# Patient Record
Sex: Female | Born: 1960
Health system: Southern US, Community
[De-identification: ages and names within clinical notes are randomized; demographics above are authoritative.]

## PROBLEM LIST (undated history)

## (undated) DIAGNOSIS — J189 Pneumonia, unspecified organism: Secondary | ICD-10-CM

## (undated) DIAGNOSIS — G43909 Migraine, unspecified, not intractable, without status migrainosus: Secondary | ICD-10-CM

## (undated) DIAGNOSIS — K5792 Diverticulitis of intestine, part unspecified, without perforation or abscess without bleeding: Secondary | ICD-10-CM

## (undated) DIAGNOSIS — R42 Dizziness and giddiness: Secondary | ICD-10-CM

## (undated) DIAGNOSIS — K5790 Diverticulosis of intestine, part unspecified, without perforation or abscess without bleeding: Secondary | ICD-10-CM

## (undated) DIAGNOSIS — E669 Obesity, unspecified: Secondary | ICD-10-CM

## (undated) HISTORY — DX: Migraine, unspecified, not intractable, without status migrainosus: G43.909

## (undated) HISTORY — DX: Diverticulitis of intestine, part unspecified, without perforation or abscess without bleeding: K57.92

## (undated) HISTORY — DX: Dizziness and giddiness: R42

## (undated) HISTORY — PX: OTHER SURGICAL HISTORY: SHX169

## (undated) HISTORY — DX: Obesity, unspecified: E66.9

## (undated) HISTORY — DX: Pneumonia, unspecified organism: J18.9

## (undated) HISTORY — DX: Diverticulosis of intestine, part unspecified, without perforation or abscess without bleeding: K57.90

---

## 1998-06-10 ENCOUNTER — Other Ambulatory Visit: Admission: RE | Admit: 1998-06-10 | Discharge: 1998-06-10 | Payer: Self-pay | Admitting: Obstetrics & Gynecology

## 1999-07-23 ENCOUNTER — Other Ambulatory Visit: Admission: RE | Admit: 1999-07-23 | Discharge: 1999-07-23 | Payer: Self-pay | Admitting: Obstetrics & Gynecology

## 2001-02-01 ENCOUNTER — Other Ambulatory Visit: Admission: RE | Admit: 2001-02-01 | Discharge: 2001-02-01 | Payer: Self-pay | Admitting: Obstetrics & Gynecology

## 2002-03-17 ENCOUNTER — Other Ambulatory Visit: Admission: RE | Admit: 2002-03-17 | Discharge: 2002-03-17 | Payer: Self-pay | Admitting: Obstetrics & Gynecology

## 2003-08-06 ENCOUNTER — Other Ambulatory Visit: Admission: RE | Admit: 2003-08-06 | Discharge: 2003-08-06 | Payer: Self-pay | Admitting: Obstetrics & Gynecology

## 2004-08-20 ENCOUNTER — Other Ambulatory Visit: Admission: RE | Admit: 2004-08-20 | Discharge: 2004-08-20 | Payer: Self-pay | Admitting: Obstetrics & Gynecology

## 2005-01-26 ENCOUNTER — Ambulatory Visit: Payer: Self-pay | Admitting: Internal Medicine

## 2005-07-27 ENCOUNTER — Ambulatory Visit: Payer: Self-pay | Admitting: Internal Medicine

## 2005-11-27 ENCOUNTER — Ambulatory Visit: Payer: Self-pay | Admitting: Internal Medicine

## 2006-04-02 ENCOUNTER — Encounter: Admission: RE | Admit: 2006-04-02 | Discharge: 2006-04-02 | Payer: Self-pay | Admitting: Internal Medicine

## 2006-04-02 ENCOUNTER — Ambulatory Visit: Payer: Self-pay | Admitting: Internal Medicine

## 2006-09-20 ENCOUNTER — Ambulatory Visit: Payer: Self-pay | Admitting: Family Medicine

## 2007-06-21 ENCOUNTER — Encounter: Payer: Self-pay | Admitting: Internal Medicine

## 2008-01-16 ENCOUNTER — Ambulatory Visit: Payer: Self-pay | Admitting: Family Medicine

## 2008-01-16 DIAGNOSIS — J45909 Unspecified asthma, uncomplicated: Secondary | ICD-10-CM | POA: Insufficient documentation

## 2008-02-20 ENCOUNTER — Ambulatory Visit: Payer: Self-pay | Admitting: Internal Medicine

## 2008-02-20 DIAGNOSIS — F17200 Nicotine dependence, unspecified, uncomplicated: Secondary | ICD-10-CM

## 2008-02-20 DIAGNOSIS — R05 Cough: Secondary | ICD-10-CM

## 2008-02-20 DIAGNOSIS — R059 Cough, unspecified: Secondary | ICD-10-CM | POA: Insufficient documentation

## 2008-03-25 ENCOUNTER — Emergency Department (HOSPITAL_COMMUNITY): Admission: EM | Admit: 2008-03-25 | Discharge: 2008-03-25 | Payer: Self-pay | Admitting: Emergency Medicine

## 2008-04-13 ENCOUNTER — Ambulatory Visit: Payer: Self-pay | Admitting: Internal Medicine

## 2008-04-13 DIAGNOSIS — J302 Other seasonal allergic rhinitis: Secondary | ICD-10-CM

## 2008-04-13 DIAGNOSIS — J3089 Other allergic rhinitis: Secondary | ICD-10-CM

## 2008-04-30 ENCOUNTER — Ambulatory Visit: Payer: Self-pay | Admitting: Internal Medicine

## 2008-07-12 ENCOUNTER — Ambulatory Visit: Payer: Self-pay | Admitting: Internal Medicine

## 2009-01-17 ENCOUNTER — Ambulatory Visit: Payer: Self-pay | Admitting: Internal Medicine

## 2009-02-26 ENCOUNTER — Telehealth (INDEPENDENT_AMBULATORY_CARE_PROVIDER_SITE_OTHER): Payer: Self-pay | Admitting: *Deleted

## 2009-02-26 ENCOUNTER — Ambulatory Visit: Payer: Self-pay | Admitting: Internal Medicine

## 2009-02-27 ENCOUNTER — Telehealth: Payer: Self-pay | Admitting: Internal Medicine

## 2009-04-10 ENCOUNTER — Ambulatory Visit: Payer: Self-pay | Admitting: Family Medicine

## 2009-04-10 DIAGNOSIS — H8110 Benign paroxysmal vertigo, unspecified ear: Secondary | ICD-10-CM

## 2009-06-27 ENCOUNTER — Encounter: Admission: RE | Admit: 2009-06-27 | Discharge: 2009-06-27 | Payer: Self-pay | Admitting: Otolaryngology

## 2009-09-24 ENCOUNTER — Ambulatory Visit: Payer: Self-pay | Admitting: Internal Medicine

## 2009-09-24 DIAGNOSIS — R41840 Attention and concentration deficit: Secondary | ICD-10-CM

## 2009-09-26 ENCOUNTER — Encounter: Payer: Self-pay | Admitting: Internal Medicine

## 2009-09-26 ENCOUNTER — Ambulatory Visit: Payer: Self-pay | Admitting: Licensed Clinical Social Worker

## 2009-10-01 ENCOUNTER — Ambulatory Visit: Payer: Self-pay | Admitting: Licensed Clinical Social Worker

## 2009-10-02 ENCOUNTER — Ambulatory Visit: Payer: Self-pay | Admitting: Licensed Clinical Social Worker

## 2010-04-14 ENCOUNTER — Telehealth (INDEPENDENT_AMBULATORY_CARE_PROVIDER_SITE_OTHER): Payer: Self-pay | Admitting: *Deleted

## 2010-05-06 NOTE — Assessment & Plan Note (Signed)
Summary: acute dizziness/dm   Vital Signs:  Patient profile:   50 year old female Weight:      180 pounds Temp:     98.4 degrees F oral BP sitting:   110 / 82  (left arm) Cuff size:   regular  Vitals Entered By: Sid Falcon LPN (April 10, 2009 9:54 AM) CC: acute dizziness X 2 days   History of Present Illness: Acute visit for dizziness. This initially started 2 days ago. This is consistently triggered by head movement and mostly to the left side. By description this is a vertigo type dizziness. No syncope  and no presyncopal or lightheaded symptoms. Denies palpitations. Symptoms seem to be triggered more with movement to the left side. No acute hearing changes. Denies nausea, vomiting, speech changes, visual changes or a focal weakness. Took a decongestant without relief. Denies any ear fullness or ear pain. No fever or chills.  Allergies: 1)  ! Penicillin 2)  ! Sulfa 3)  Mobic (Meloxicam)  Past History:  Past Medical History: Last updated: 04/13/2008 Asthma  dx 30      ETS as a child  Pneumonia   Review of Systems      See HPI  Physical Exam  General:  Well-developed,well-nourished,in no acute distress; alert,appropriate and cooperative throughout examination Eyes:  No corneal or conjunctival inflammation noted. EOMI. Perrla. Funduscopic exam benign, without hemorrhages, exudates or papilledema. Vision grossly normal. Ears:  External ear exam shows no significant lesions or deformities.  Otoscopic examination reveals clear canals, tympanic membranes are intact bilaterally without bulging, retraction, inflammation or discharge. Hearing is grossly normal bilaterally. Mouth:  Oral mucosa and oropharynx without lesions or exudates.  Teeth in good repair. Neck:  No deformities, masses, or tenderness noted. Lungs:  Normal respiratory effort, chest expands symmetrically. Lungs are clear to auscultation, no crackles or wheezes. Heart:  Normal rate and regular rhythm. S1 and S2  normal without gallop, murmur, click, rub or other extra sounds. Neurologic:  alert & oriented X3, cranial nerves II-XII intact, strength normal in all extremities, sensation intact to light touch, and finger-to-nose normal.  patient has vertigo which is triggered with movement of the head to the left but not the right No nystagmus. No ataxia   Impression & Recommendations:  Problem # 1:  BENIGN PAROXYSMAL POSITIONAL VERTIGO (ICD-386.11) Assessment New we explained to patient medications are frequent not helpful. Consider vestibular rehabilitation exercises if symptoms persist. Discussed safety issues  associated with this.  No driving until this improves. Her updated medication list for this problem includes:    Zyrtec Allergy 10 Mg Tabs (Cetirizine hcl) .[name]... Take 1 by mouth once daily  Complete Medication List: 1)  Ventolin Hfa 108 (90 Base) Mcg/act Aers (Albuterol sulfate) .... 2 puffs every 4 hours as needed 2)  Flonase 50 Mcg/act Susp (Fluticasone propionate) .... 2 sprays each nares q d 3)  Qvar 40 Mcg/act Aers (Beclomethasone dipropionate) .... 2 puffs two times a day 4)  Zyrtec Allergy 10 Mg Tabs (Cetirizine hcl) .... Take 1 by mouth once daily  Patient Instructions: 1)  avoid driving until symptoms have fully cleared. 2)  Change positions slowly. 3)  Follow up immediately if any new symptoms such as vision change, speech change or any focal weakness.

## 2010-05-06 NOTE — Letter (Signed)
Summary: Behavioral Medicine Note, Neurocognitive Test Results   Behavioral Medicine Note, Neurocognitive Test Results   Imported By: Maryln Gottron 11/05/2009 11:15:28  _____________________________________________________________________  External Attachment:    Type:   Image     Comment:   External Document

## 2010-05-06 NOTE — Assessment & Plan Note (Signed)
Summary: consult re: adult adhd/cjr   Vital Signs:  Patient profile:   50 year old female Menstrual status:  perimenopausal Height:      66.5 inches Weight:      194 pounds Pulse rate:   72 / minute BP sitting:   120 / 60  (right arm) Cuff size:   regular  Vitals Entered By: Romualdo Bolk, CMA (AAMA) (September 24, 2009 1:09 PM) CC: Discuss ADD- Family Hx of ADD- Pt has trouble focusing, easily distracted. She has more unfinished stuff in house.      Menstrual Status perimenopausal   History of Present Illness: Elizabeth Pratt comes in today   for above concern  last appt with me was 11/09 .    Since last time has had no major change in health status has gyne and pulm doc for  appropriate care .   However  recently  Noted children in the family  with add  issues  and dx  She did some  screening surveys ? adult ars scale    on line   . She says she "  Looks off the scale"  of adhd. symptoms     Now child   Elizabeth Pratt    dx :  with  add   recently has noted same experience and signs in her own life.      Mom noted mirrored experience.       poor finishing   and better in a crisiis than deadline  Also life changes  that . mom passed this year.     job changes .      Problems with relationship with Husband    . 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    Now  current but very disorganized  .  pilles of paid bills all over   and none are filed.   Now is  beginning to  shred things.  Some days are better than others r    With increase caffiene.   NOtes more efficiency.  Procrastination with difficult tasks.     [name] good for emergencies.    School issues  when young   .   where she didnt finish college  .   did well when put the effort in to concentrate. No depression,  sleep issues.    except  menopause.        Preventive Screening-Counseling & Management  Alcohol-Tobacco     Alcohol drinks/day: <1     Alcohol type: wine     Smoking Status:  current     Packs/Day: <0.25  Caffeine-Diet-Exercise     Caffeine use/day: 4-5 cups in am     Does Patient Exercise: no  Current Medications (verified): 1)  Ventolin Hfa 108 (90 Base) Mcg/act Aers (Albuterol Sulfate) .... 2 Puffs Every 4 Hours As Needed 2)  Flonase 50 Mcg/act Susp (Fluticasone Propionate) .... 2 Sprays Each Nares Q D 3)  Qvar 40 Mcg/act Aers (Beclomethasone Dipropionate) .... 2 Puffs Two Times A Day 4)  Zyrtec Allergy 10 Mg Tabs (Cetirizine Hcl) .... Take 1 By Mouth Once Daily  Allergies (verified): 1)  ! Penicillin 2)  ! Sulfa 3)  Mobic (Meloxicam)  Past History:  Past medical, surgical, family and social histories (including risk factors) reviewed, and no changes noted (except as noted below).  Past Medical History: Asthma  dx 30      ETS as a child  Pneumonia  Vertigo evaluation  poss atypical  migraines   ? MRI   Past Surgical History: Reviewed history from 04/13/2008 and no changes required. Dental extractions  Past History:  Care Management: Pulmonary: Young Orthopedics: Gramig Gynecology: Arlyce Dice ENT:   Family History: Reviewed history from 02/26/2009 and no changes required. Mom has asthma , breast ca- died  ? attentional  2/5 sisters have asthma .    smokers.  sisterbreast ca, sister- ovarian ca  ssiter   /  family hx of add  son  Social History: Reviewed history from 04/13/2008 and no changes required. 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         vertigo    eval  mri and other tests and thyroid and normal    ? atypical   migraine.    NEg  CV problems   Physical Exam  General:  Well-developed,well-nourished,in no acute distress; alert,appropriate and cooperative throughout examination talkaive and animated  no increase motor othrewise  Head:  normocephalic and atraumatic.   Neck:  No deformities, masses, or tenderness noted. Lungs:  Normal respiratory effort, chest expands symmetrically. Lungs are clear to auscultation, no crackles or wheezes. Heart:  Normal rate and regular rhythm. S1 and S2 normal without gallop, murmur, click, rub or other extra sounds. Abdomen:  Bowel sounds positive,abdomen soft and non-tender without masses, organomegaly or   noted. Pulses:  pulses intact without delay   Extremities:  no clubbing cyanosis or edema  Neurologic:  alert & oriented X3, strength normal in all extremities, gait normal, and DTRs symmetrical and normal.   no tremor and non focal  Skin:  turgor normal and color normal.   Cervical Nodes:  No lymphadenopathy noted Psych:  Oriented X3.  Pt is A&Ox3,affect,speech,memory,attention,&motor skills appear intact.   speaks fast  but normal thought process  and cognition   Impression & Recommendations:  Problem # 1:  ATTENTION OR CONCENTRATION DEFICIT (ICD-799.51) criteria appear to be met for add   by hx  and family hx and  no other  explanations .   but  never had any evaluation    will have her make appt for  further eval  refer to behavioral health  [name].  and then follow up to see if medcation approrpriate.  does appear to have affected  more than one area of life ( school drop out of college and home life organization )but does well with job .    Problem # 2:  TOBACCO USE (ICD-305.1) should quit  counseled   Complete Medication List: 1)  Ventolin Hfa 108 (90 Base) Mcg/act Aers (Albuterol sulfate) .... 2 puffs every 4 hours as needed 2)  Flonase 50 Mcg/act Susp (Fluticasone propionate) .... 2 sprays each nares q d 3)  Qvar 40 Mcg/act Aers (Beclomethasone dipropionate) .... 2  puffs two times a day 4)  Zyrtec Allergy 10 Mg Tabs (Cetirizine hcl) .... Take 1 by mouth once daily  Patient Instructions: 1)  your hx if consistent with add  .    2)  suggest some  further evaluation.   [name]  3)  Then follow up  visit.  4)  You need to stop smoking.

## 2010-05-08 NOTE — Progress Notes (Signed)
Summary: rx req- today  Phone Note Call from Patient   Caller: Patient Call For: young Summary of Call: pt has made an appt w/ dr young. please call in rx for ventolin today asap. she will keep her appt. cell [phone] Initial call taken by: Tivis Ringer, CNA,  April 14, 2010 5:08 PM  Follow-up for Phone Call        Spoke with pt and notifed rx was sent to pharm and that she should keep rov for 05/09/10 for future refills.   Pt verbalized understanding. Follow-up by: Vernie Murders,  April 14, 2010 5:11 PM    Prescriptions: VENTOLIN HFA 108 (90 BASE) MCG/ACT AERS (ALBUTEROL SULFATE) 2 puffs every 4 hours as needed  #1 x 0   Entered by:   Vernie Murders   Authorized by:   Waymon Budge MD   Signed by:   Vernie Murders on 04/14/2010   Method used:   Electronically to        [facility]. [phone]* (retail)       [phone]       [place], [place]  [address]       Ph: [phone]       Fax: [phone]   RxID:   [id number]

## 2010-05-09 ENCOUNTER — Encounter: Payer: Self-pay | Admitting: Internal Medicine

## 2010-05-09 ENCOUNTER — Ambulatory Visit: Admit: 2010-05-09 | Payer: Self-pay | Admitting: Internal Medicine

## 2010-05-09 ENCOUNTER — Ambulatory Visit (INDEPENDENT_AMBULATORY_CARE_PROVIDER_SITE_OTHER): Payer: Managed Care, Other (non HMO) | Admitting: Internal Medicine

## 2010-05-09 DIAGNOSIS — F17200 Nicotine dependence, unspecified, uncomplicated: Secondary | ICD-10-CM

## 2010-05-09 DIAGNOSIS — J45909 Unspecified asthma, uncomplicated: Secondary | ICD-10-CM

## 2010-05-09 DIAGNOSIS — J309 Allergic rhinitis, unspecified: Secondary | ICD-10-CM

## 2010-05-14 NOTE — Assessment & Plan Note (Signed)
Summary: ROV   Vital Signs:  Patient profile:   50 year old female Menstrual status:  perimenopausal O2 Sat:      97 % on Room air Pulse rate:   68 / minute BP sitting:   142 / 86  (right arm) Cuff size:   large  Vitals Entered By: Gweneth Dimitri RN (May 09, 2010 3:26 PM)  O2 Flow:  Room air CC: Follow up for rxs.  Pt states breathing is doing well overall.  Denies SOB, wheezing, chest tightness, cough.   Comments Medications reviewed with patient Daytime contact number verified with patient. Gweneth Dimitri RN  May 09, 2010 3:27 PM    Primary Provider/Referring Provider:  Panosh  CC:  Follow up for rxs.  Pt states breathing is doing well overall.  Denies SOB, wheezing, chest tightness, and cough.  .  History of Present Illness:  07/12/08- Asthma, allergic rhinitis, tobacco Pollen is not bothering her as badly as some years. Does have some cough - not purulent or bloody. Uses Mucinex as needed. Using Ventolin rescue inhaler about once every other day. Continues using Qvar  and flonase. Reviewed PFT= mild obstruction small airways with response to bronchodilator.  January 17, 2009- Asthma, allergic rhinitis, tobacco Meds have usually done well and been sufficient. Using rescue Ventolin only twice weekly. No acute changes.  February 26, 2009- Asthma, allergic rhinitis, tobacco 1 week acutely ill after trip out of town,. She and daughter got sick. Chest congestion, productive cough green, low fever. Took some left over prednisone. Used up a rescue inhaler. Not bad sinus inflammation. Using mucinex-d.  May 09, 2010- Asthma, allergic rhinitis, tobacco Nurse-CC: Follow up for rxs.  Pt states breathing is doing well overall.  Denies SOB, wheezing, chest tightness, cough.   Trying to quit smoking. Got hoarse and gained weight when she tried off cigarettes for 3 weeks. We discussed smoking cesssation process. Chest feels ok with no cough, wheeze etc. Minor colds working  with kids.    Asthma History    Initial Asthma Severity Rating:    Age range: 12+ years    Symptoms: 0-2 days/week    Nighttime Awakenings: 0-2/month    Interferes w/ normal activity: no limitations    SABA use (not for EIB): 0-2 days/week    Asthma Severity Assessment: Intermittent   Preventive Screening-Counseling & Management  Alcohol-Tobacco     Alcohol drinks/day: <1     Alcohol type: wine     Smoking Status: current     Smoking Cessation Counseling: yes     Smoke Cessation Stage: contemplative     Packs/Day: <0.25     Tobacco Counseling: to quit use of tobacco products  Current Medications (verified): 1)  Ventolin Hfa 108 (90 Base) Mcg/act Aers (Albuterol Sulfate) .... 2 Puffs Every 4 Hours As Needed 2)  Flonase 50 Mcg/act Susp (Fluticasone Propionate) .... 2 Sprays Each Nares Once Daily As Needed 3)  Qvar 40 Mcg/act Aers (Beclomethasone Dipropionate) .... 2 Puffs Two Times A Day 4)  Zyrtec Allergy 10 Mg Tabs (Cetirizine Hcl) .... Take 1 By Mouth Once Daily  Allergies (verified): 1)  ! Penicillin 2)  ! Sulfa 3)  Mobic (Meloxicam)  Past History:  Past Medical History: Last updated: 09/24/2009 Asthma  dx 30      ETS as a child  Pneumonia  Vertigo evaluation  poss atypical  migraines   ? MRI   Past Surgical History: Last updated: 04/13/2008 Dental extractions  Family History:  Last updated: 09/24/2009 Mom has asthma , breast ca- died  ? attentional  2/5 sisters have asthma .    smokers.  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  The patient denies shortness of breath with activity, shortness of breath at rest, productive cough, coughing up blood, chest pain, irregular heartbeats, acid heartburn, indigestion, loss of appetite, weight change, abdominal pain, difficulty swallowing, sore throat, tooth/dental problems, headaches, and sneezing.    Physical Exam  Additional Exam:  General: A/Ox3; pleasant and cooperative, NAD, SKIN: no rash, lesions. Flushed NODES: no lymphadenopathy HEENT: [place]/AT, EOM- WNL, Conjuctivae- clear, PERRLA, TM-WNL, Nose- clear, Throat- clear and wnl, Mallampati  II NECK: Supple w/ fair ROM, JVD- none, normal carotid impulses w/o bruits Thyroid-  CHEST: harsh rattling cough with wheeze HEART: RRR, no m/g/r heard ABDOMEN: EAV:WUJW, nl pulses, no edema  NEURO: Grossly intact to observation      Impression & Recommendations:  Problem # 1:  ALLERGIC RHINITIS (ICD-477.9) Tobacco irritation, vasomotor rhinitis and allergic rhinitis were discussed and compared. Possible mild viral rhinitis now. Her updated medication list for this problem includes:    Flonase 50 Mcg/act Susp (Fluticasone propionate) .[name]... 2 sprays each nares once daily as needed    Zyrtec Allergy 10 Mg Tabs (Cetirizine hcl) .[name]... Take 1 by mouth once daily  Problem # 2:  TOBACCO USE (ICD-305.1) We spent 20 minutes on smoking cessation today. I think she can get a grip on this issue with encouragement to try again.   Problem # 3:  ACUTE BRONCHITIS (ICD-466.0) Intermittent bronchitis. Her updated medication list for this problem includes:    Ventolin Hfa 108 (90 Base) Mcg/act Aers (Albuterol sulfate) .[name]... 2 puffs every 4 hours as needed    Qvar 40 Mcg/act Aers (Beclomethasone dipropionate) .[name]... 2 puffs two times a day  Medications Added to Medication List This Visit: 1)  Flonase 50 Mcg/act Susp (Fluticasone propionate)  .... 2 sprays each nares once daily as needed  Other Orders: Est. Patient Level III (11914)  Patient Instructions: 1)  Please schedule a follow-up appointment in 6 months. 2)  Please keep trying with the smoking- in the long run you will be better off without the cigarettes.  3)  Consider trying otc Nasalcrom/ cromolyn nasal spray 4)  Med refills Prescriptions: QVAR 40 MCG/ACT AERS (BECLOMETHASONE DIPROPIONATE) 2 puffs two times a day  #3 x 3   Entered and Authorized by:   Waymon Budge MD   Signed by:   Waymon Budge MD on 05/09/2010   Method used:   Print then Give to Patient   RxID:   [id number] FLONASE 50 MCG/ACT SUSP (FLUTICASONE PROPIONATE) 2 sprays each nares once daily as needed  #3 x 3   Entered and Authorized by:   Waymon Budge MD   Signed by:   Waymon Budge MD on 05/09/2010   Method  used:   Print then Give to Patient   RxID:   [id number] VENTOLIN HFA 108 (90 BASE) MCG/ACT AERS (ALBUTEROL SULFATE) 2 puffs every 4 hours as needed  #3 x 3   Entered and Authorized by:   Waymon Budge MD   Signed by:   Waymon Budge MD on 05/09/2010   Method used:   Print then Give to Patient   RxID:   [id number]    Orders Added: 1)  Est. Patient Level III [95284]

## 2010-08-26 ENCOUNTER — Telehealth: Payer: Self-pay | Admitting: Internal Medicine

## 2010-08-26 NOTE — Telephone Encounter (Signed)
Katie, pls advise status of this form thanks!

## 2010-08-27 NOTE — Telephone Encounter (Signed)
I have called patient to see what information she is referring to. Schedulers are aware to direct call to me only!

## 2010-08-27 NOTE — Telephone Encounter (Signed)
Please let patient know that CY nor myself have seen any papers on patient-also that any insurance papers should be turned into [employer] not [place]. Pt will need to have papers sent there instead.

## 2010-08-27 NOTE — Telephone Encounter (Signed)
CY-completed forms and pt aware that I personally faxed the insurance information back today at 5:26pm and will mail her a copy of all the information for her records.Elizabeth Pratt

## 2010-08-27 NOTE — Telephone Encounter (Signed)
Pt states she faxed forms for life insurance to [place] on 08/18/2010 to [phone] and received confirmation that the fax went through. I spoke with Renee in [employer] and they have no record that these forms were sent to them. Elizabeth Pratt, pt request that we recheck Dr. Roxy Cedar paperwork and she is very upset that this has not been handled because she was told to fax forms to your attention. Pls recheck for paperwork and advise pt. She is requesting a return call.

## 2010-08-27 NOTE — Telephone Encounter (Signed)
I spoke with patient-states that she had papers faxed to my attention per a scheduler or fill in at the front to our front fax machine on 08-18-10 at 4:45 pm (confirmed her fax from her side). I explained to her that I have not seen the papers but will look into this. I have looked in all of CY's papers to be completed and even asked him regarding this matter; he states he has not seen any papers. I also double checked with [employer](rachel) who states she doesn't have any records of any papers for pt.    I left a message at pts work number to call me back today after 2pm so I may discuss this matter with her personally. I will be glad to get [employer]'s number and ask for the fax to be resent to me at our Triage fax marked urgent for CY to complete this afternoon/morning(CY off the pm) and fax Urgent to [employer].Vivianne Spence

## 2010-08-27 NOTE — Telephone Encounter (Signed)
This sounds like something for [employer]-I dont hang on to any papers like this. Please check with [employer]; otherwise I dont have papers.

## 2010-08-27 NOTE — Telephone Encounter (Signed)
Pt called me back and she understands I was unable to locate any papers on her; she has faxed them again to me at the triage fax; I got them and CY has them to fill out tonight/morning so I can fax them back by 5pm tomorrow. Pt understands and wishes to have a copy mailed to her home address.

## 2010-09-03 ENCOUNTER — Other Ambulatory Visit: Payer: Self-pay | Admitting: Internal Medicine

## 2010-09-08 ENCOUNTER — Encounter: Payer: Self-pay | Admitting: *Deleted

## 2010-09-08 ENCOUNTER — Ambulatory Visit (INDEPENDENT_AMBULATORY_CARE_PROVIDER_SITE_OTHER): Payer: Managed Care, Other (non HMO) | Admitting: Adult Health

## 2010-09-08 ENCOUNTER — Telehealth: Payer: Self-pay | Admitting: Internal Medicine

## 2010-09-08 DIAGNOSIS — J209 Acute bronchitis, unspecified: Secondary | ICD-10-CM

## 2010-09-08 MED ORDER — PREDNISONE 10 MG PO TABS: ORAL_TABLET | ORAL | Status: AC

## 2010-09-08 MED ORDER — HYDROCODONE-HOMATROPINE 5-1.5 MG/5ML PO SYRP: 5.0000 mL | ORAL_SOLUTION | Freq: Four times a day (QID) | ORAL | Status: AC | PRN

## 2010-09-08 MED ORDER — CEFDINIR 300 MG PO CAPS: 300.0000 mg | ORAL_CAPSULE | Freq: Two times a day (BID) | ORAL | Status: AC

## 2010-09-08 MED ORDER — BECLOMETHASONE DIPROPIONATE 40 MCG/ACT IN AERS: 2.0000 | INHALATION_SPRAY | Freq: Two times a day (BID) | RESPIRATORY_TRACT | Status: DC

## 2010-09-08 NOTE — Progress Notes (Signed)
  Subjective:    Patient ID: Elizabeth Pratt, female    DOB: 1960/12/14, 50 y.o.   MRN: [id number]  HPI 50 yo female with known hx of asthma  09/08/10 Acute OV  Pr presents for an acute office visit. Complains of prod cough with "clear, slimy" mucus, using resue q2h, fever up to 101, wheezing, increased SOB x3days.  OTC meds not helping. Started with nasal drip and drainage. Wheezing is getting worse.  Cough is keeping her up at night.    Review of Systems Constitutional:   No  weight loss, night sweats,  Fevers, chills, fatigue, or  lassitude.  HEENT:   No headaches,  Difficulty swallowing,  Tooth/dental problems, or  Sore throat,                + sneezing, itching, nasal congestion, post nasal drip,   CV:  No chest pain,  Orthopnea, PND, swelling in lower extremities, anasarca, dizziness, palpitations, syncope.   GI  No heartburn, indigestion, abdominal pain, nausea, vomiting, diarrhea, change in bowel habits, loss of appetite, bloody stools.   Resp: No shortness of breath with exertion or at rest.  No excess mucus, no productive cough,  No non-productive cough,  No coughing up of blood.  No change in color of mucus.  No wheezing.  No chest wall deformity  Skin: no rash or lesions.  GU: no dysuria, change in color of urine, no urgency or frequency.  No flank pain, no hematuria   MS:  No joint pain or swelling.  No decreased range of motion.  No back pain.  Psych:  No change in mood or affect. No depression or anxiety.  No memory loss.         Objective:   Physical Exam GEN: A/Ox3; pleasant , NAD  HEENT:  [place]/AT,  EACs-clear, TMs-wnl, NOSE-clear drainage , THROAT-clear, no lesions, no postnasal drip or exudate noted.   NECK:  Supple w/ fair ROM; no JVD; normal carotid impulses w/o bruits; no thyromegaly or nodules palpated; no lymphadenopathy.  RESP  Coarse BS w/ faint exp wheezeno accessory muscle use, no dullness to percussion  CARD:  RRR, no m/r/g  , no peripheral edema,  pulses intact, no cyanosis or clubbing.  GI:   Soft & nt; nml bowel sounds; no organomegaly or masses detected.  Musco: Warm bil, no deformities or joint swelling noted.   Neuro: alert, no focal deficits noted.    Skin: Warm, no lesions or rashes         Assessment & Plan:

## 2010-09-08 NOTE — Telephone Encounter (Signed)
Per CY-okay to see TP.

## 2010-09-08 NOTE — Patient Instructions (Signed)
Omnicef 300mg Twice daily  For 7 days  Mucinex DM Twice daily  As needed  Cough/congestion  Prednisone taper over next week  Fluids and rest  May use Hydromet 1-2 tsp every 4-6 hr As needed  Cough- may make you sleepy Please contact office for sooner follow up if symptoms do not improve or worsen or seek emergency care  follow up Dr. Young  As planned and As needed     

## 2010-09-08 NOTE — Telephone Encounter (Signed)
Called, spoke with pt.  States she started getting a cold over the weekend.  Having difficulty breathing, chest and head congestion, wheezing, and chest heaviness.  Mucus is clear now but pt states this turns green very quickly.  Using mucinex and dayquil with no relief.  Using rescue inhaler q2h.  Requesting OV today after 3pm - believes she may need depo injection.  Ok to see TP if CDY ok with this.  Dr. Maple Hudson, pls advise.  Thanks!

## 2010-09-08 NOTE — Telephone Encounter (Signed)
Patient called back scheduled with [name] Today at 3:15pm.Elizabeth Pratt [place]

## 2010-09-11 NOTE — Assessment & Plan Note (Signed)
Omnicef 300mg  Twice daily  For 7 days  Mucinex DM Twice daily  As needed  Cough/congestion  Prednisone taper over next week  Fluids and rest  May use Hydromet 1-2 tsp every 4-6 hr As needed  Cough- may make you sleepy Please contact office for sooner follow up if symptoms do not improve or worsen or seek emergency care  follow up Dr. Maple Hudson  As planned and As needed

## 2011-02-25 ENCOUNTER — Ambulatory Visit (INDEPENDENT_AMBULATORY_CARE_PROVIDER_SITE_OTHER): Payer: Managed Care, Other (non HMO) | Admitting: Internal Medicine

## 2011-02-25 ENCOUNTER — Other Ambulatory Visit: Payer: Self-pay | Admitting: Internal Medicine

## 2011-02-25 ENCOUNTER — Encounter: Payer: Self-pay | Admitting: Internal Medicine

## 2011-02-25 VITALS — BP 110/60 | HR 60 | Temp 99.1°F | Wt 185.0 lb

## 2011-02-25 DIAGNOSIS — F17200 Nicotine dependence, unspecified, uncomplicated: Secondary | ICD-10-CM

## 2011-02-25 DIAGNOSIS — A09 Infectious gastroenteritis and colitis, unspecified: Secondary | ICD-10-CM

## 2011-02-25 MED ORDER — PROMETHAZINE HCL 25 MG PO TABS: 25.0000 mg | ORAL_TABLET | Freq: Four times a day (QID) | ORAL | Status: DC | PRN

## 2011-02-25 NOTE — Patient Instructions (Signed)
This act s like acute infec tious gastroenteritis and should resolve on its own however if continuing we should get stool tests  Gatorade water pedialyte for hydration med for nausea if needed. No  Alcohol and caffiene  Good hand washing. Small amounts of fluid frequently will prevent dehydration. Call on Friday if diarrhea not subsiding and we cann arrange for stool tests.  Stopping tobacco is the best thing for your health.

## 2011-02-25 NOTE — Progress Notes (Signed)
  Subjective:    Patient ID: Elizabeth Pratt, female    DOB: [birth year], 50 y.o.   MRN: [id number]  HPI Patient comes in today for SDA  For acute problem evaluation. With husband  Acute onset diarrhea watery  Profuse .  4 days ago then had post prandial  abd pain  ;Epigastric pain and diarrhea slowed down.Has anorexia and had sandwich of  Ham ( Pre packaged. from school. ) and glass of wine .  And then had  Upper mid abd pain and then vomited  30 minutes later .  Recurrent vomiting last pm none this am  Has used  immodium.  Stool an vomit is dark not blood   Has had a few out  of water.  Has some chills . No one else sick.  NO unusual  travel  Well water. Raw food Review of Systems No fever but some chills and clammy no uti sx hematuria sob wheezing or uri sx no bleeding  No hx of recurrent gi issues  Past history family history social history reviewed in the electronic medical record.     Objective:   Physical Exam  WDWN in nad looks midly ill non toxic  Here with husband  Mild cough nl turgor and respirations HEENT: Normocephalic ;atraumatic , Eyes;  PERRL, EOMs  Full, lids and conjunctiva clear,,Ears: no deformities, canals nl, TM landmarks normal, Nose: no deformity or discharge  Mouth : OP clear without lesion or edema . Chest:  Clear to A&P without wheezes rales or rhonchi CV:  S1-S2 no gallops or murmurs peripheral perfusion is normal Abdomen:  Sof,t very hyperactive  bowel sounds without hepatosplenomegaly, no guarding rebound or masses no CVA tenderness points to area epigastric   Skin: normal capillary refill ,turgor , color: No acute rashes ,petechiae or bruising Neuro grossly non focal     Assessment & Plan:  Acute NVD and epigastric pain    prob infectious GE   Min mild dehydration   Disc supportive care and stool cx  i f     persistent or progressive   gatorade pedialyte water etc  phenergan if needed  Call with alarm sx  Consider  Stool cx etc.  Tobacco  Advise  stop  tobacco.   Total visit > 50% spent counseling and coordinating care

## 2011-02-25 NOTE — Telephone Encounter (Signed)
Called back for directions.

## 2011-02-25 NOTE — Telephone Encounter (Signed)
Please call pharm to verify promethazine directions

## 2011-03-10 ENCOUNTER — Telehealth: Payer: Self-pay | Admitting: Internal Medicine

## 2011-03-10 MED ORDER — PREDNISONE 10 MG PO TABS: ORAL_TABLET | ORAL | Status: DC

## 2011-03-10 NOTE — Telephone Encounter (Signed)
Pt c/o increase asthma symptoms for past 2 weeks.  Increased SOB and wheezing at night. Prod cough (clear).  Chest tightness.  No fever.  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  Please advise.  [facility] [place]

## 2011-03-10 NOTE — Telephone Encounter (Signed)
Per CY-okay to give Prednisone 10 mg #20 take 4 x 2 days, 3 x 2 days, 2 x 2 days, 1 x 2 days, no refills. Pt is aware of Rx sent to pharmacy.

## 2011-03-20 ENCOUNTER — Other Ambulatory Visit: Payer: Self-pay | Admitting: Internal Medicine

## 2011-04-21 ENCOUNTER — Other Ambulatory Visit: Payer: Self-pay | Admitting: Internal Medicine

## 2011-04-21 ENCOUNTER — Telehealth: Payer: Self-pay | Admitting: Internal Medicine

## 2011-04-21 MED ORDER — ALBUTEROL SULFATE HFA 108 (90 BASE) MCG/ACT IN AERS: INHALATION_SPRAY | RESPIRATORY_TRACT | Status: DC

## 2011-04-21 NOTE — Telephone Encounter (Signed)
I spoke with pt and is aware rx was sent and that she needs to show up for her OV for further refills. She voiced her understanding and nothing further was needed

## 2011-05-11 ENCOUNTER — Ambulatory Visit (INDEPENDENT_AMBULATORY_CARE_PROVIDER_SITE_OTHER)
Admission: RE | Admit: 2011-05-11 | Discharge: 2011-05-11 | Disposition: A | Discharge status: Home / Self Care | Payer: Managed Care, Other (non HMO) | Source: Ambulatory Visit | Attending: Adult Health | Admitting: Adult Health

## 2011-05-11 ENCOUNTER — Ambulatory Visit (INDEPENDENT_AMBULATORY_CARE_PROVIDER_SITE_OTHER): Payer: Managed Care, Other (non HMO) | Admitting: Internal Medicine

## 2011-05-11 ENCOUNTER — Encounter: Payer: Self-pay | Admitting: Internal Medicine

## 2011-05-11 VITALS — BP 130/80 | HR 71 | Ht 68.0 in | Wt 185.0 lb

## 2011-05-11 DIAGNOSIS — J42 Unspecified chronic bronchitis: Secondary | ICD-10-CM

## 2011-05-11 DIAGNOSIS — J40 Bronchitis, not specified as acute or chronic: Secondary | ICD-10-CM

## 2011-05-11 DIAGNOSIS — J309 Allergic rhinitis, unspecified: Secondary | ICD-10-CM

## 2011-05-11 DIAGNOSIS — F17200 Nicotine dependence, unspecified, uncomplicated: Secondary | ICD-10-CM

## 2011-05-11 MED ORDER — BUDESONIDE-FORMOTEROL FUMARATE 160-4.5 MCG/ACT IN AERO: 2.0000 | INHALATION_SPRAY | Freq: Two times a day (BID) | RESPIRATORY_TRACT | Status: DC

## 2011-05-11 MED ORDER — ALBUTEROL SULFATE HFA 108 (90 BASE) MCG/ACT IN AERS: 2.0000 | INHALATION_SPRAY | RESPIRATORY_TRACT | Status: DC | PRN

## 2011-05-11 NOTE — Progress Notes (Signed)
Patient ID: Elizabeth Pratt, female    DOB: 1960-10-14, 51 y.o.   MRN: [id number]  HPI 51 yo female with known hx of asthma  09/08/10 Acute OV  Pr presents for an acute office visit. Complains of prod cough with "clear, slimy" mucus, using resue q2h, fever up to 101, wheezing, increased SOB x3days.  OTC meds not helping. Started with nasal drip and drainage. Wheezing is getting worse.  Cough is keeping her up at night.   05/11/11- 49 yoF former smoker (Dec, 2012) followed for asthma, bronchitis Seen here June 4 by the nurse practitioner. I last saw her 05/09/10. She quit smoking on her own 6 or 7 weeks ago and was not attempted and her smoking sister visited. She has a productive cough with clear phlegm. Using her rescue inhaler about twice a day. Is not as effective/lasting as she wishes. Continues Qvar 40. Change to Zyrtec to Claritin. Dyspnea on exertion with activity much more vigorous than usual ADLs. Denies chest pain, nodes, blood.  PFT 04/30/08- FEV1 2.79/99%, FEV1/FVC 0.75, significant response to bronchodilator, TLC 94%, DLCO 109%.  ROS-see HPI Constitutional:   No-   weight loss, night sweats, fevers, chills, fatigue, lassitude. HEENT:   No-  headaches, difficulty swallowing, tooth/dental problems, sore throat,       No-  sneezing, itching, ear ache, nasal congestion, post nasal drip,  CV:  No-   chest pain, orthopnea, PND, swelling in lower extremities, anasarca, dizziness, palpitations Resp: shortness of breath with  + exertion not at rest.          + productive cough,  No non-productive cough,  No- coughing up of blood.              No-   change in color of mucus.  + wheezing.   Skin: No-   rash or lesions. GI:  No-   heartburn, indigestion, abdominal pain, nausea, vomiting, diarrhea,                 change in bowel habits, loss of appetite GU: No-   dysuria, MS:  No-   joint pain or swelling.  No- decreased range of motion.  No- back pain. Neuro-     nothing unusual Psych:  No-  change in mood or affect. No depression or anxiety.  No memory loss.  Objective:  OBJ- Physical Exam General- Alert, Oriented, Affect-appropriate, Distress- none acute Skin- rash-none, lesions- none, excoriation- none Lymphadenopathy- none Head- atraumatic            Eyes- Gross vision intact, PERRLA, conjunctivae and secretions clear            Ears- Hearing, canals-normal            Nose- Clear, no-Septal dev, mucus, polyps, erosion, perforation             Throat- Mallampati II , mucosa clear , drainage- none, tonsils- atrophic Neck- flexible , trachea midline, no stridor , thyroid nl, carotid no bruit Chest - symmetrical excursion , unlabored           Heart/CV- RRR , no murmur , no gallop  , no rub, nl s1 s2                           - JVD- none , edema- none, stasis changes- none, varices- none           Lung- slight I&E wheeze in bases, cough- none ,  dullness-none, rub- none           Chest wall-  Abd- Br/ Gen/ Rectal- Not done, not indicated Extrem- cyanosis- none, clubbing, none, atrophy- none, strength- nl Neuro- grossly intact to observation

## 2011-05-11 NOTE — Patient Instructions (Signed)
Order- CXR   Dx bronchitis  Sample/ script Symbicort 160   2 puffs and rinse twice daily every day  Script for [employer]

## 2011-05-13 NOTE — Assessment & Plan Note (Signed)
I will encouraged her to keep up the good work. I think she can stay off.

## 2011-05-13 NOTE — Assessment & Plan Note (Addendum)
Sustained a smoking cessation should lead to gradual improvement. She may need at least intermittent bronchodilator therapy. We discussed her use of a rescue inhaler. Plan-chest x-ray. Add trial of Symbicort

## 2011-05-13 NOTE — Assessment & Plan Note (Signed)
With smoking cessation, a nonspecific irritant is removed. We will watch during the upcoming spring pollen season.

## 2011-06-12 ENCOUNTER — Other Ambulatory Visit: Payer: Self-pay | Admitting: Internal Medicine

## 2011-08-27 ENCOUNTER — Telehealth: Payer: Self-pay | Admitting: Internal Medicine

## 2011-08-27 MED ORDER — PREDNISONE 10 MG PO TABS: ORAL_TABLET | ORAL | Status: DC

## 2011-08-27 NOTE — Telephone Encounter (Signed)
I spoke with pt and is aware of CDY recs. Rx has been sent to the pharmacy 

## 2011-08-27 NOTE — Telephone Encounter (Signed)
Per CY-okay to give Prednisone 10 mg #20 take 4 x 2 days, 3 x 2 days,2 x 2 days, 1 x 2 days, then stop no refills; also use Mucinex DM.

## 2011-08-27 NOTE — Telephone Encounter (Signed)
I spoke with pt and she c/o terrible cough w/ clear thick phlem, chest congestion, wheezing, chest tx, nasal congestion, and sinus pressure x 08/22/11. Denies any f/c/s/n/v. She has tried delsym, mucinex, mucinex d, and is using her inhaler. Pt is requesting to have prednisone called in and whatever else CDY recommends. Please advise Dr. Maple Hudson, thanks  Allergies  Allergen Reactions  . Meloxicam     REACTION: dizziness  . Penicillins   . Sulfonamide Derivatives      [facility]

## 2011-09-07 ENCOUNTER — Ambulatory Visit (INDEPENDENT_AMBULATORY_CARE_PROVIDER_SITE_OTHER): Payer: 59 | Admitting: Internal Medicine

## 2011-09-07 ENCOUNTER — Encounter: Payer: Self-pay | Admitting: Internal Medicine

## 2011-09-07 VITALS — BP 120/76 | HR 64 | Ht 68.0 in | Wt 185.0 lb

## 2011-09-07 DIAGNOSIS — F17200 Nicotine dependence, unspecified, uncomplicated: Secondary | ICD-10-CM

## 2011-09-07 DIAGNOSIS — J42 Unspecified chronic bronchitis: Secondary | ICD-10-CM

## 2011-09-07 MED ORDER — AZITHROMYCIN 250 MG PO TABS: ORAL_TABLET | ORAL | Status: AC

## 2011-09-07 MED ORDER — BUDESONIDE-FORMOTEROL FUMARATE 160-4.5 MCG/ACT IN AERO: 2.0000 | INHALATION_SPRAY | Freq: Two times a day (BID) | RESPIRATORY_TRACT | Status: DC

## 2011-09-07 NOTE — Progress Notes (Signed)
Patient ID: Elizabeth Pratt, female    DOB: 07-07-1960, 51 y.o.   MRN: [id number]  HPI 51 yo female with known hx of asthma  09/08/10 Acute OV  Pr presents for an acute office visit. Complains of prod cough with "clear, slimy" mucus, using resue q2h, fever up to 101, wheezing, increased SOB x3days.  OTC meds not helping. Started with nasal drip and drainage. Wheezing is getting worse.  Cough is keeping her up at night.   05/11/11- 49 yoF former smoker (Dec, 2012) followed for asthma, bronchitis Seen here June 4 by the nurse practitioner. I last saw her 05/09/10. She quit smoking on her own 6 or 7 weeks ago and was not tempted when her smoking sister visited. She has a productive cough with clear phlegm. Using her rescue inhaler about twice a day. Is not as effective/lasting as she wishes. Continues Qvar 40. Change to Zyrtec to Claritin. Dyspnea on exertion with activity much more vigorous than usual ADLs. Denies chest pain, nodes, blood.  PFT 04/30/08- FEV1 2.79/99%, FEV1/FVC 0.75, significant response to bronchodilator, TLC 94%, DLCO 109%.  09/07/11- 49 yoF former smoker (Dec, 2012) followed for asthma, bronchitis Completed prednisone taper-feels better; currently using Mucinex as well to help clear mucus. Had not taken Symbicort due to changes in pharmacies but will transfer asap. Smoking again, blaming "stress". She had called reporting increased bronchitis with cough 2 weeks ago when we had called in prednisone which helped. She just started Mucinex 2 days ago. CXR 05/13/11-  IMPRESSION:  No active cardiopulmonary disease.  Original Report Authenticated By: Donavan Burnet, M.D.    ROS-see HPI Constitutional:   No-   weight loss, night sweats, fevers, chills, fatigue, lassitude. HEENT:   No-  headaches, difficulty swallowing, tooth/dental problems, sore throat,       No-  sneezing, itching, ear ache, nasal congestion, post nasal drip,  CV:  No-   chest pain, orthopnea, PND, swelling in lower  extremities, anasarca, dizziness, palpitations Resp: +shortness of breath with  + exertion not at rest.          + productive cough,  No non-productive cough,  No- coughing up of blood.              No-   change in color of mucus.  + wheezing.   Skin: No-   rash or lesions. GI:  No-   heartburn, indigestion, abdominal pain, nausea, vomiting,  GU: , MS:  No-   joint pain or swelling.  . Neuro-     nothing unusual Psych:  No- change in mood or affect. No depression or anxiety.  No memory loss.  Objective:  OBJ- Physical Exam General- Alert, Oriented, Affect-appropriate, Distress- none acute, overweight Skin- rash-none, lesions- none, excoriation- none Lymphadenopathy- none Head- atraumatic            Eyes- Gross vision intact, PERRLA, conjunctivae and secretions clear            Ears- Hearing, canals-normal            Nose- Clear, no-Septal dev, mucus, polyps, erosion, perforation             Throat- Mallampati II , mucosa clear , drainage- none, tonsils- atrophic Neck- flexible , trachea midline, no stridor , thyroid nl, carotid no bruit Chest - symmetrical excursion , unlabored           Heart/CV- RRR , no murmur , no gallop  , no rub, nl s1 s2                           -  JVD- none , edema- none, stasis changes- none, varices- none           Lung- slight I&E wheeze in bases, cough- deep , dullness-none, rub- none           Chest wall-  Abd- Br/ Gen/ Rectal- Not done, not indicated Extrem- cyanosis- none, clubbing, none, atrophy- none, strength- nl Neuro- grossly intact to observation

## 2011-09-07 NOTE — Patient Instructions (Addendum)
Script to hold for Z pak in case antibiotic needed  Script for Symbicort   Please get off the cigarettes  You don't need them  Use mucinex as long as you need

## 2011-09-11 ENCOUNTER — Encounter: Payer: Self-pay | Admitting: Internal Medicine

## 2011-09-11 NOTE — Assessment & Plan Note (Signed)
We discussed her pattern of smoking when she feels stressed. Offered smoking cessation program.

## 2011-09-11 NOTE — Assessment & Plan Note (Signed)
Acute exacerbation of bronchitis, she has improved with a recent prednisone taper. She had run out of Symbicort. Plan-refill Symbicort 160. Smoking cessation.

## 2012-01-14 ENCOUNTER — Encounter: Payer: Self-pay | Admitting: Internal Medicine

## 2012-03-07 ENCOUNTER — Ambulatory Visit (INDEPENDENT_AMBULATORY_CARE_PROVIDER_SITE_OTHER): Payer: 59 | Admitting: Internal Medicine

## 2012-03-07 ENCOUNTER — Encounter: Payer: Self-pay | Admitting: Internal Medicine

## 2012-03-07 VITALS — BP 112/74 | HR 79 | Ht 66.0 in | Wt 185.0 lb

## 2012-03-07 DIAGNOSIS — F17200 Nicotine dependence, unspecified, uncomplicated: Secondary | ICD-10-CM

## 2012-03-07 DIAGNOSIS — J42 Unspecified chronic bronchitis: Secondary | ICD-10-CM

## 2012-03-07 NOTE — Progress Notes (Signed)
Patient ID: Elizabeth Pratt, female    DOB: 1960/08/30, 51 y.o.   MRN: [id number]  HPI 51 yo female with known hx of asthma  09/08/10 Acute OV  Pr presents for an acute office visit. Complains of prod cough with "clear, slimy" mucus, using resue q2h, fever up to 101, wheezing, increased SOB x3days.  OTC meds not helping. Started with nasal drip and drainage. Wheezing is getting worse.  Cough is keeping her up at night.   05/11/11- 49 yoF former smoker (Dec, 2012) followed for asthma, bronchitis Seen here June 4 by the nurse practitioner. I last saw her 05/09/10. She quit smoking on her own 6 or 7 weeks ago and was not tempted when her smoking sister visited. She has a productive cough with clear phlegm. Using her rescue inhaler about twice a day. Is not as effective/lasting as she wishes. Continues Qvar 40. Change to Zyrtec to Claritin. Dyspnea on exertion with activity much more vigorous than usual ADLs. Denies chest pain, nodes, blood.  PFT 04/30/08- FEV1 2.79/99%, FEV1/FVC 0.75, significant response to bronchodilator, TLC 94%, DLCO 109%.  09/07/11- 49 yoF former smoker (Dec, 2012) followed for asthma, bronchitis Completed prednisone taper-feels better; currently using Mucinex as well to help clear mucus. Had not taken Symbicort due to changes in pharmacies but will transfer asap. Smoking again, blaming "stress". She had called reporting increased bronchitis with cough 2 weeks ago when we had called in prednisone which helped. She just started Mucinex 2 days ago. CXR 05/13/11-  IMPRESSION:  No active cardiopulmonary disease.  Original Report Authenticated By: Donavan Burnet, M.D.   03/07/12- 31 yoF smoker followed for asthma, bronchitis FOLLOWS FOR: breathing has improved since last OV. Denies chest pain, tightness or increased SOB. [name] has had flu vaccine. Smoking on and off and says she "quit" again 2 weeks ago. I strongly encouraged her to stay off permanently, emphasizing intact on her breathing.  We discussed recommended use of Symbicort.  ROS-see HPI Constitutional:   No-   weight loss, night sweats, fevers, chills, fatigue, lassitude. HEENT:   No-  headaches, difficulty swallowing, tooth/dental problems, sore throat,       No-  sneezing, itching, ear ache, + some nasal congestion, post nasal drip,  CV:  No-   chest pain, orthopnea, PND, swelling in lower extremities, anasarca, dizziness, palpitations Resp: +shortness of breath with  + exertion not at rest.          + productive cough,  No non-productive cough,  No- coughing up of blood.              No-   change in color of mucus.  Little wheezing.   Skin: No-   rash or lesions. GI:  No-   heartburn, indigestion, abdominal pain, nausea, vomiting,  GU: , MS:  No-   joint pain or swelling.  . Neuro-     nothing unusual Psych:  No- change in mood or affect. No depression or anxiety.  No memory loss.  Objective:  OBJ- Physical Exam General- Alert, Oriented, Affect-appropriate, Distress- none acute, overweight Skin- rash-none, lesions- none, excoriation- none Lymphadenopathy- none Head- atraumatic            Eyes- Gross vision intact, PERRLA, conjunctivae and secretions clear            Ears- Hearing, canals-normal            Nose- Clear, no-Septal dev, mucus, polyps, erosion, perforation  Throat- Mallampati II , mucosa clear , drainage- none, tonsils- atrophic Neck- flexible , trachea midline, no stridor , thyroid nl, carotid no bruit Chest - symmetrical excursion , unlabored           Heart/CV- RRR , no murmur , no gallop  , no rub, nl s1 s2                           - JVD- none , edema- none, stasis changes- none, varices- none           Lung- clear, cough- minimal , dullness-none, rub- none           Chest wall-  Abd- Br/ Gen/ Rectal- Not done, not indicated Extrem- cyanosis- none, clubbing, none, atrophy- none, strength- nl Neuro- grossly intact to observation

## 2012-03-07 NOTE — Patient Instructions (Addendum)
Ok to try any variation of Symbicort dosing up to a max of 2 puffs, twice daily  Info [facility] smoking cessation class   I certainly support your efforts to stop smoking before bigger problems show up.

## 2012-03-17 NOTE — Assessment & Plan Note (Signed)
Improved bronchitis as she has reduced her average number of cigarettes per day. We discussed symptoms and physical activity.

## 2012-03-17 NOTE — Assessment & Plan Note (Signed)
Obviously critical issue. I reminded her of some available smoking cessation resources. Weight gain concern.

## 2012-04-21 ENCOUNTER — Telehealth: Payer: Self-pay | Admitting: Internal Medicine

## 2012-04-21 MED ORDER — FLUTICASONE PROPIONATE 50 MCG/ACT NA SUSP: NASAL | Status: DC

## 2012-04-21 NOTE — Telephone Encounter (Signed)
Pt stated she needed 90 day supply sent. I have sent in RX. Nothing further was needed

## 2012-04-21 NOTE — Telephone Encounter (Signed)
lmomtcb x1 does she want 30 days supply or 90 day supply

## 2012-04-22 ENCOUNTER — Other Ambulatory Visit: Payer: Self-pay | Admitting: Internal Medicine

## 2012-04-22 MED ORDER — FLUTICASONE PROPIONATE 50 MCG/ACT NA SUSP: NASAL | Status: DC

## 2012-04-22 NOTE — Telephone Encounter (Signed)
Pt called back and stated that this was sent to the wrong pharmacy----pt stated that we were all idiots and was very rude on the phone to chantel.  Pt did hang up and stated that she did not need to be on the phone for this to be corrected.  90 day suuply of the flonase [facility] [address] of [address].  Nothing further is needed.

## 2012-05-26 ENCOUNTER — Telehealth: Payer: Self-pay | Admitting: Internal Medicine

## 2012-05-26 ENCOUNTER — Other Ambulatory Visit: Payer: Self-pay | Admitting: Internal Medicine

## 2012-05-26 MED ORDER — ALBUTEROL SULFATE HFA 108 (90 BASE) MCG/ACT IN AERS: 2.0000 | INHALATION_SPRAY | RESPIRATORY_TRACT | Status: DC | PRN

## 2012-05-26 NOTE — Telephone Encounter (Signed)
rx for the proair has been sent to the pharmacy and i called and spoke with the pt and she is aware. Nothing further is needed.

## 2012-07-18 ENCOUNTER — Encounter: Payer: Self-pay | Admitting: Internal Medicine

## 2012-07-18 ENCOUNTER — Ambulatory Visit (INDEPENDENT_AMBULATORY_CARE_PROVIDER_SITE_OTHER): Payer: 59 | Admitting: Internal Medicine

## 2012-07-18 VITALS — BP 160/96 | HR 77 | Temp 99.0°F

## 2012-07-18 DIAGNOSIS — L659 Nonscarring hair loss, unspecified: Secondary | ICD-10-CM

## 2012-07-18 DIAGNOSIS — Z8349 Family history of other endocrine, nutritional and metabolic diseases: Secondary | ICD-10-CM

## 2012-07-18 DIAGNOSIS — R030 Elevated blood-pressure reading, without diagnosis of hypertension: Secondary | ICD-10-CM

## 2012-07-18 DIAGNOSIS — J45909 Unspecified asthma, uncomplicated: Secondary | ICD-10-CM

## 2012-07-18 DIAGNOSIS — R5381 Other malaise: Secondary | ICD-10-CM

## 2012-07-18 DIAGNOSIS — F17200 Nicotine dependence, unspecified, uncomplicated: Secondary | ICD-10-CM

## 2012-07-18 DIAGNOSIS — R5383 Other fatigue: Secondary | ICD-10-CM

## 2012-07-18 NOTE — Patient Instructions (Signed)
Will notify you  of labs when available.  Add resistance   Training   To your activity .   If these tests are normal   Would still monitor   Yearly or so for this condition. Consider am cortisol levels   Or 24 hours urine for cortisol.

## 2012-07-18 NOTE — Progress Notes (Signed)
Chief Complaint  Patient presents with  . Hair thinning  . Depressed  . Numbness  . Weight Gain    HPI: Patient comes in today for SDA for  new problem evaluation.  Last ov was 2012   With pcp   Concerning recently about worseniig sx and consideration for other causes besides aging .   Tingling dry skin and hair loss  Shedding in the past year.  and  Now menopausal     lmp age 52   .  Sisters   3/5  and mom had hypothyroid  ? Graves.   And so came in to get checked    . No dm   Mom had 12 siblings and diabetes present.    Sleep:   Ok about 7 hours .    Onset  Fatigue and other issues  As long as can remember.   Working  8 - 3   Tobacco   :  Decreasing  1-2 per day. For now.   ROS: See pertinent positives and negatives per HPI. Tobacco pulm sees dr Maple Hudson for  Her asthmatic issues   Past Medical History  Diagnosis Date  . Asthma dx age 59  . Pneumonia   . Vertigo   . Migraines     poss atypical ? MRI    Family History  Problem Relation Age of Onset  . Asthma Mother   . Breast cancer Mother   . Asthma Sister     smoker  . Asthma Sister     smoker  . Breast cancer Sister   . Ovarian cancer Sister   . ADD / ADHD Son     History   Social History  . Marital Status: Single    Spouse Name: N/A    Number of Children: N/A  . Years of Education: N/A   Occupational History  . Administrator at [employer]    Social History Main Topics  . Smoking status: Current Every Day Smoker -- 0.10 packs/day  . Smokeless tobacco: None     Comment: has quit "lots of times"  . Alcohol Use: None  . Drug Use: None  . Sexually Active: None   Other Topics Concern  . 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 albuterol (PROAIR HFA) 108 (90 BASE) MCG/ACT inhaler Inhale 2 puffs into the lungs every 4 (four) hours as needed for  wheezing or shortness of breath.  8.5 g  prn  . budesonide-formoterol (SYMBICORT) 160-4.5 MCG/ACT inhaler Inhale 2 puffs into the lungs 2 (two) times daily. Rinse mouth  1 Inhaler  12  . cetirizine (ZYRTEC) 10 MG tablet Take 10 mg by mouth daily.      . fluticasone (FLONASE) 50 MCG/ACT nasal spray 2 sprays in each nostril once daily as needed  48 g  3  . [DISCONTINUED] Loratadine (CLARITIN PO) Take by mouth daily.       No facility-administered encounter medications on file as of 07/18/2012.    EXAM:  BP 160/96  Pulse 77  Temp(Src) 99 F (37.2 C) (Oral)  SpO2 97%  Body mass index is 0.00 kg/(m^2).  GENERAL: vitals reviewed and listed above, alert, oriented, appears well hydrated and in no acute distress  HEENT: atraumatic, conjunctiva  clear, no obvious abnormalities on inspection of external nose and ears  OP : no lesion edema or exudate   NECK: no obvious masses on inspection palpation no adenopathy  Thyroid is possible palpable no nodules   LUNGS: clear to auscultation bilaterally, no wheezes, rales or rhonchi, good air movement  CV: HRRR, no clubbing cyanosis or  peripheral edema nl cap refill  No tremor  s MS: moves all extremities without noticeable focal  abnormality Skin: normal capillary refill ,turgor , color: No acute rashes ,petechiae or bruising Scalp no scarring alopecia  PSYCH: pleasant and cooperative, no obvious depression or anxiety  Wt Readings from Last 3 Encounters:  03/07/12 185 lb (83.915 kg)  09/07/11 185 lb (83.915 kg)  05/11/11 185 lb (83.915 kg)    ASSESSMENT AND PLAN:  Discussed the following assessment and plan:  Other malaise and fatigue - multifactorial  most likely  - Plan: cetirizine (ZYRTEC) 10 MG tablet, Basic metabolic panel, TSH, T4, free, T3, free, Thyroid antibodies, Lipid panel, CBC with Differential, Hepatic function panel  Hair thinning - Plan: cetirizine (ZYRTEC) 10 MG tablet, Basic metabolic panel, TSH, T4, free, T3, free,  Thyroid antibodies, Lipid panel, CBC with Differential, Hepatic function panel  Family history of thyroid disease - very strong fam hx  monitoring indicated  - Plan: cetirizine (ZYRTEC) 10 MG tablet, Basic metabolic panel, TSH, T4, free, T3, free, Thyroid antibodies, Lipid panel, CBC with Differential, Hepatic function panel  ASTHMA  TOBACCO USE - rare use   dc advised  Elevated blood pressure reading - has been normal at other visits  shold recheck Need to check bp readings   BP Readings from Last 3 Encounters:  07/18/12 160/96  03/07/12 112/74  09/07/11 120/76    -Patient advised to return or notify health care team  if symptoms worsen or persist or new concerns arise.  Patient Instructions  Will notify you  of labs when available.  Add resistance   Training   To your activity .   If these tests are normal   Would still monitor   Yearly or so for this condition. Consider am cortisol levels   Or 24 hours urine for cortisol.      Neta Mends. Elizabeth Pratt M.D.

## 2012-07-19 LAB — BASIC METABOLIC PANEL
BUN: 16 mg/dL (ref 6–23)
Chloride: 104 mEq/L (ref 96–112)
Potassium: 4.1 mEq/L (ref 3.5–5.1)
Sodium: 139 mEq/L (ref 135–145)

## 2012-07-19 LAB — THYROID ANTIBODIES
Thyroglobulin Ab: <20 U/mL (ref <40.0)
Thyroperoxidase Ab SerPl-aCnc: <10 IU/mL (ref <35.0)

## 2012-07-19 LAB — HEPATIC FUNCTION PANEL
ALT: 20 U/L (ref 0–35)
AST: 20 U/L (ref 0–37)
Albumin: 4.2 g/dL (ref 3.5–5.2)

## 2012-07-19 LAB — CBC WITH DIFFERENTIAL/PLATELET
Basophils Relative: 0.5 % (ref 0.0–3.0)
Eosinophils Relative: 3.5 % (ref 0.0–5.0)
HCT: 40.2 % (ref 36.0–46.0)
Hemoglobin: 13.7 g/dL (ref 12.0–15.0)
Lymphs Abs: 1.4 10*3/uL (ref 0.7–4.0)
MCV: 88.5 fl (ref 78.0–100.0)
Monocytes Absolute: 0.4 10*3/uL (ref 0.1–1.0)
Monocytes Relative: 7.6 % (ref 3.0–12.0)
RBC: 4.54 Mil/uL (ref 3.87–5.11)
WBC: 5.6 10*3/uL (ref 4.5–10.5)

## 2012-07-19 LAB — LIPID PANEL: Total CHOL/HDL Ratio: 3

## 2012-07-19 LAB — TSH: TSH: 0.39 u[IU]/mL (ref 0.35–5.50)

## 2012-07-19 LAB — T3, FREE: T3, Free: 2.8 pg/mL (ref 2.3–4.2)

## 2012-07-19 LAB — T4, FREE: Free T4: 0.93 ng/dL (ref 0.60–1.60)

## 2012-07-20 ENCOUNTER — Telehealth: Payer: Self-pay | Admitting: Internal Medicine

## 2012-07-20 NOTE — Telephone Encounter (Signed)
Patient notified that results are not ready and will call her when I receive them from [facility].

## 2012-07-20 NOTE — Telephone Encounter (Signed)
Pt would like blood work results from 07-18-12

## 2012-07-24 DIAGNOSIS — R5383 Other fatigue: Secondary | ICD-10-CM | POA: Insufficient documentation

## 2012-07-24 DIAGNOSIS — L659 Nonscarring hair loss, unspecified: Secondary | ICD-10-CM | POA: Insufficient documentation

## 2012-07-24 DIAGNOSIS — Z8349 Family history of other endocrine, nutritional and metabolic diseases: Secondary | ICD-10-CM | POA: Insufficient documentation

## 2012-09-05 ENCOUNTER — Encounter: Payer: Self-pay | Admitting: Internal Medicine

## 2012-09-05 ENCOUNTER — Ambulatory Visit (INDEPENDENT_AMBULATORY_CARE_PROVIDER_SITE_OTHER): Payer: 59 | Admitting: Internal Medicine

## 2012-09-05 VITALS — BP 160/88 | HR 79 | Ht 66.0 in | Wt 200.8 lb

## 2012-09-05 DIAGNOSIS — J4530 Mild persistent asthma, uncomplicated: Secondary | ICD-10-CM

## 2012-09-05 DIAGNOSIS — J302 Other seasonal allergic rhinitis: Secondary | ICD-10-CM

## 2012-09-05 DIAGNOSIS — J309 Allergic rhinitis, unspecified: Secondary | ICD-10-CM

## 2012-09-05 DIAGNOSIS — F17200 Nicotine dependence, unspecified, uncomplicated: Secondary | ICD-10-CM

## 2012-09-05 DIAGNOSIS — J45909 Unspecified asthma, uncomplicated: Secondary | ICD-10-CM

## 2012-09-05 MED ORDER — BUDESONIDE-FORMOTEROL FUMARATE 160-4.5 MCG/ACT IN AERO: 2.0000 | INHALATION_SPRAY | Freq: Two times a day (BID) | RESPIRATORY_TRACT | Status: DC

## 2012-09-05 MED ORDER — FLUTICASONE PROPIONATE 50 MCG/ACT NA SUSP: NASAL | Status: DC

## 2012-09-05 MED ORDER — ALBUTEROL SULFATE HFA 108 (90 BASE) MCG/ACT IN AERS: 2.0000 | INHALATION_SPRAY | RESPIRATORY_TRACT | Status: DC | PRN

## 2012-09-05 NOTE — Patient Instructions (Addendum)
Med refills  Please call as needed

## 2012-09-05 NOTE — Progress Notes (Signed)
Patient ID: Elizabeth Pratt, female    DOB: 1961/01/21, 52 y.o.   MRN: [id number]  HPI 52 yo female with known hx of asthma  09/08/10 Acute OV  Pr presents for an acute office visit. Complains of prod cough with "clear, slimy" mucus, using resue q2h, fever up to 101, wheezing, increased SOB x3days.  OTC meds not helping. Started with nasal drip and drainage. Wheezing is getting worse.  Cough is keeping her up at night.   05/11/11- 49 yoF former smoker (Dec, 2012) followed for asthma, bronchitis Seen here June 4 by the nurse practitioner. I last saw her 05/09/10. She quit smoking on her own 6 or 7 weeks ago and was not tempted when her smoking sister visited. She has a productive cough with clear phlegm. Using her rescue inhaler about twice a day. Is not as effective/lasting as she wishes. Continues Qvar 40. Change to Zyrtec to Claritin. Dyspnea on exertion with activity much more vigorous than usual ADLs. Denies chest pain, nodes, blood.  PFT 04/30/08- FEV1 2.79/99%, FEV1/FVC 0.75, significant response to bronchodilator, TLC 94%, DLCO 109%.  09/07/11- 49 yoF former smoker (Dec, 2012) followed for asthma, bronchitis Completed prednisone taper-feels better; currently using Mucinex as well to help clear mucus. Had not taken Symbicort due to changes in pharmacies but will transfer asap. Smoking again, blaming "stress". She had called reporting increased bronchitis with cough 2 weeks ago when we had called in prednisone which helped. She just started Mucinex 2 days ago. CXR 05/13/11-  IMPRESSION:  No active cardiopulmonary disease.  Original Report Authenticated By: Donavan Burnet, M.D.   03/07/12- 27 yoF smoker followed for asthma, bronchitis FOLLOWS FOR: breathing has improved since last OV. Denies chest pain, tightness or increased SOB. [name] has had flu vaccine. Smoking on and off and says she "quit" again 2 weeks ago. I strongly encouraged her to stay off permanently, emphasizing inpact on her breathing.  We discussed recommended use of Symbicort.  09/05/12- 51 yoF smoker followed for asthma, bronchitis FOLLOWS FOR: Doing well even through the allergy season Smokes intermittently- less than in past. We discussed this again. Good spring season. Less exposure to school-age children and their colds.  ROS-see HPI Constitutional:   No-   weight loss, night sweats, fevers, chills, fatigue, lassitude. HEENT:   No-  headaches, difficulty swallowing, tooth/dental problems, sore throat,       No-  sneezing, itching, ear ache, + some nasal congestion, post nasal drip,  CV:  No-   chest pain, orthopnea, PND, swelling in lower extremities, anasarca, dizziness, palpitations Resp: +shortness of breath with  + exertion not at rest.          No-productive cough,  No non-productive cough,  No- coughing up of blood.              No-   change in color of mucus.  No-wheezing.   Skin: No-   rash or lesions. GI:  No-   heartburn, indigestion, abdominal pain, nausea, vomiting,  GU: , MS:  No-   joint pain or swelling.  . Neuro-     nothing unusual Psych:  No- change in mood or affect. No depression or anxiety.  No memory loss.  Objective:  OBJ- Physical Exam- unremarkable General- Alert, Oriented, Affect-appropriate, Distress- none acute, overweight Skin- rash-none, lesions- none, excoriation- none Lymphadenopathy- none Head- atraumatic            Eyes- Gross vision intact, PERRLA, conjunctivae and secretions clear  Ears- Hearing, canals-normal            Nose- Clear, no-Septal dev, mucus, polyps, erosion, perforation             Throat- Mallampati II , mucosa clear , drainage- none, tonsils- atrophic Neck- flexible , trachea midline, no stridor , thyroid nl, carotid no bruit Chest - symmetrical excursion , unlabored           Heart/CV- RRR , no murmur , no gallop  , no rub, nl s1 s2                           - JVD- none , edema- none, stasis changes- none, varices- none           Lung- clear,  cough- minimal , dullness-none, rub- none           Chest wall-  Abd- Br/ Gen/ Rectal- Not done, not indicated Extrem- cyanosis- none, clubbing, none, atrophy- none, strength- nl Neuro- grossly intact to observation

## 2012-09-16 NOTE — Assessment & Plan Note (Signed)
Better control with avoidance of viral respiratory infections.

## 2012-09-16 NOTE — Assessment & Plan Note (Signed)
She is satisfied to continue present meds. I again discussed the likelihood that there is a tobacco related ureteric rhinitis component.

## 2012-09-16 NOTE — Assessment & Plan Note (Signed)
Emphasis on smoking cessation. She is closer I think she can do it.

## 2013-01-05 ENCOUNTER — Ambulatory Visit: Payer: 59 | Admitting: Internal Medicine

## 2013-02-09 ENCOUNTER — Other Ambulatory Visit: Payer: Self-pay

## 2013-02-23 ENCOUNTER — Encounter: Payer: Self-pay | Admitting: Internal Medicine

## 2013-02-23 ENCOUNTER — Ambulatory Visit (INDEPENDENT_AMBULATORY_CARE_PROVIDER_SITE_OTHER): Payer: 59 | Admitting: Internal Medicine

## 2013-02-23 VITALS — BP 132/80 | HR 74 | Ht 66.0 in | Wt 200.0 lb

## 2013-02-23 DIAGNOSIS — F17200 Nicotine dependence, unspecified, uncomplicated: Secondary | ICD-10-CM

## 2013-02-23 DIAGNOSIS — J209 Acute bronchitis, unspecified: Secondary | ICD-10-CM

## 2013-02-23 DIAGNOSIS — Z23 Encounter for immunization: Secondary | ICD-10-CM

## 2013-02-23 DIAGNOSIS — J069 Acute upper respiratory infection, unspecified: Secondary | ICD-10-CM

## 2013-02-23 DIAGNOSIS — J45909 Unspecified asthma, uncomplicated: Secondary | ICD-10-CM

## 2013-02-23 MED ORDER — MOMETASONE FURO-FORMOTEROL FUM 100-5 MCG/ACT IN AERO: INHALATION_SPRAY | RESPIRATORY_TRACT | Status: DC

## 2013-02-23 MED ORDER — DOXYCYCLINE HYCLATE 100 MG PO TABS: ORAL_TABLET | ORAL | Status: DC

## 2013-02-23 MED ORDER — MOMETASONE FURO-FORMOTEROL FUM 100-5 MCG/ACT IN AERO: 2.0000 | INHALATION_SPRAY | Freq: Two times a day (BID) | RESPIRATORY_TRACT | Status: DC

## 2013-02-23 MED ORDER — ALBUTEROL SULFATE HFA 108 (90 BASE) MCG/ACT IN AERS: 2.0000 | INHALATION_SPRAY | RESPIRATORY_TRACT | Status: DC | PRN

## 2013-02-23 MED ORDER — FLUTICASONE PROPIONATE 50 MCG/ACT NA SUSP: NASAL | Status: DC

## 2013-02-23 NOTE — Patient Instructions (Addendum)
Order- CXR dx acute bronchitis  Flu vax  Script for med refills-     Sample and script Dulera 100      2 puffs then rinse mouth, twice daily   To replace Symbicort  Script for doxycycline antibiotic

## 2013-02-23 NOTE — Progress Notes (Signed)
Patient ID: Elizabeth Pratt, female    DOB: April 24, 1960, 52 y.o.   MRN: [id number]  HPI 52 yo female with known hx of asthma  09/08/10 Acute OV  Pr presents for an acute office visit. Complains of prod cough with "clear, slimy" mucus, using resue q2h, fever up to 101, wheezing, increased SOB x3days.  OTC meds not helping. Started with nasal drip and drainage. Wheezing is getting worse.  Cough is keeping her up at night.   05/11/11- 49 yoF former smoker (Dec, 2012) followed for asthma, bronchitis Seen here June 4 by the nurse practitioner. I last saw her 05/09/10. She quit smoking on her own 6 or 7 weeks ago and was not tempted when her smoking sister visited. She has a productive cough with clear phlegm. Using her rescue inhaler about twice a day. Is not as effective/lasting as she wishes. Continues Qvar 40. Change to Zyrtec to Claritin. Dyspnea on exertion with activity much more vigorous than usual ADLs. Denies chest pain, nodes, blood.  PFT 04/30/08- FEV1 2.79/99%, FEV1/FVC 0.75, significant response to bronchodilator, TLC 94%, DLCO 109%.  09/07/11- 49 yoF former smoker (Dec, 2012) followed for asthma, bronchitis Completed prednisone taper-feels better; currently using Mucinex as well to help clear mucus. Had not taken Symbicort due to changes in pharmacies but will transfer asap. Smoking again, blaming "stress". She had called reporting increased bronchitis with cough 2 weeks ago when we had called in prednisone which helped. She just started Mucinex 2 days ago. CXR 05/13/11-  IMPRESSION:  No active cardiopulmonary disease.  Original Report Authenticated By: Donavan Burnet, M.D.   03/07/12- 28 yoF smoker followed for asthma, bronchitis FOLLOWS FOR: breathing has improved since last OV. Denies chest pain, tightness or increased SOB. [name] has had flu vaccine. Smoking on and off and says she "quit" again 2 weeks ago. I strongly encouraged her to stay off permanently, emphasizing inpact on her breathing.  We discussed recommended use of Symbicort.  09/05/12- 51 yoF smoker followed for asthma, bronchitis FOLLOWS FOR: Doing well even through the allergy season Smokes intermittently- less than in past. We discussed this again. Good spring season. Less exposure to school-age children and their colds.  02/23/13- 65 yoF smoker followed for asthma, bronchitis Pt c/o prod cough with clear mucus. She may be smoking less currently but she has not made a real effort to quit now. Recent upper respiratory infection with residual thick mucus, nonpurulent. No fever or sore throat. She feels this is taking long to clear. CXR 05/11/11 IMPRESSION:  No active cardiopulmonary disease.  Original Report Authenticated By: Donavan Burnet, M.D.  ROS-see HPI Constitutional:   No-   weight loss, night sweats, fevers, chills, fatigue, lassitude. HEENT:   No-  headaches, difficulty swallowing, tooth/dental problems, sore throat,       No-  sneezing, itching, ear ache, + some nasal congestion, +post nasal drip,  CV:  No-   chest pain, orthopnea, PND, swelling in lower extremities, anasarca, dizziness, palpitations Resp: +shortness of breath with  + exertion not at rest.          +-productive cough,  No non-productive cough,  No- coughing up of blood.              No-   change in color of mucus.  No-wheezing.   Skin: No-   rash or lesions. GI:  No-   heartburn, indigestion, abdominal pain, nausea, vomiting,  GU: , MS:  No-   joint pain or  swelling.  . Neuro-     nothing unusual Psych:  No- change in mood or affect. No depression or anxiety.  No memory loss.  Objective:  OBJ- Physical Exam- unremarkable General- Alert, Oriented, Affect-appropriate, Distress- none acute, overweight Skin- rash-none, lesions- none, excoriation- none Lymphadenopathy- none Head- atraumatic            Eyes- Gross vision intact, PERRLA, conjunctivae and secretions clear            Ears- Hearing, canals-normal            Nose-  no-Septal  dev, mucus+, no-polyps, erosion, perforation             Throat- Mallampati II , mucosa clear , drainage- none, tonsils- atrophic Neck- flexible , trachea midline, no stridor , thyroid nl, carotid no bruit Chest - symmetrical excursion , unlabored           Heart/CV- RRR , no murmur , no gallop  , no rub, nl s1 s2                           - JVD- none , edema- none, stasis changes- none, varices- none           Lung- clear, cough+with deep breath , dullness-none, rub- none           Chest wall-  Abd- Br/ Gen/ Rectal- Not done, not indicated Extrem- cyanosis- none, clubbing, none, atrophy- none, strength- nl Neuro- grossly intact to observation

## 2013-03-12 DIAGNOSIS — J069 Acute upper respiratory infection, unspecified: Secondary | ICD-10-CM | POA: Insufficient documentation

## 2013-03-12 NOTE — Assessment & Plan Note (Signed)
Plan-refill medications with discussion, doxycycline, smoking cessation, chest x-ray, flu vaccine

## 2013-03-12 NOTE — Assessment & Plan Note (Signed)
Plan-recent upper respiratory infection aggravated by her smoking as discussed with her.

## 2013-03-12 NOTE — Assessment & Plan Note (Signed)
Key Issue is ongoing smoking. She knows it. Plan-chest x-ray, flu vaccine, doxycycline, smoking cessation

## 2013-03-27 ENCOUNTER — Ambulatory Visit: Payer: 59 | Admitting: Internal Medicine

## 2013-04-21 ENCOUNTER — Ambulatory Visit (INDEPENDENT_AMBULATORY_CARE_PROVIDER_SITE_OTHER): Payer: 59 | Admitting: Internal Medicine

## 2013-04-21 ENCOUNTER — Encounter: Payer: Self-pay | Admitting: Internal Medicine

## 2013-04-21 VITALS — BP 138/92 | HR 88 | Temp 98.5°F | Resp 12 | Ht 68.0 in | Wt 200.6 lb

## 2013-04-21 DIAGNOSIS — R5383 Other fatigue: Principal | ICD-10-CM

## 2013-04-21 DIAGNOSIS — Z8349 Family history of other endocrine, nutritional and metabolic diseases: Secondary | ICD-10-CM

## 2013-04-21 DIAGNOSIS — R5381 Other malaise: Secondary | ICD-10-CM

## 2013-04-21 LAB — T3, FREE: T3, Free: 2.7 pg/mL (ref 2.3–4.2)

## 2013-04-21 LAB — SEDIMENTATION RATE: SED RATE: 27 mm/h — AB (ref 0–22)

## 2013-04-21 LAB — VITAMIN B12: Vitamin B-12: 289 pg/mL (ref 211–911)

## 2013-04-21 LAB — TSH: TSH: 0.65 u[IU]/mL (ref 0.35–5.50)

## 2013-04-21 LAB — T4, FREE: Free T4: 0.98 ng/dL (ref 0.60–1.60)

## 2013-04-21 NOTE — Progress Notes (Signed)
Patient ID: Elizabeth Pratt, female   DOB: 06-11-1960, 53 y.o.   MRN: [id number]   HPI  Elizabeth Pratt is a 53 y.o.-year-old female-year-old female, referred by her PCP, Dr. Regis Bill, for evaluation for possible hypothyroidism (pt has family hx of hypothyroidism) and also fatigue.  Pt. has not been dx with hypothyroidism; her TFTs were checked 24 years ago during her pregnancy >> "borderline". A repeat check 7 mo ago >> normal  I reviewed pt's thyroid tests: Component     Latest Ref Rng 07/18/2012  Thyroid Peroxidase Antibody     <35.0 IU/mL <10.0  Thyroglobulin Ab     <40.0 U/mL <20.0  TSH     0.35 - 5.50 uIU/mL 0.39  Free T4     0.60 - 1.60 ng/dL 0.93  T3, Free     2.3 - 4.2 pg/mL 2.8   Pt denies feeling nodules in neck, hoarseness, dysphagia/odynophagia, SOB with lying down.  Pt describes: - cold intolerance - hands and feet are cold; even when she has hot flushes - + weight gain - baseline weight 150 lbs >> steadily increasing to 200 lbs - + fatigue - for years - + constipation - now improved - + dry skin - + hair falling and thinning - depression - intolerance to exercise (walking the dog >> very tired). - has a h/o carpal tunnel in R arm  - menopausal since 53 y/o; but previously PMS and dysmenorrhea  - easy bruising - slow healing wounds  She has + FH of thyroid disorders in: mother, father, 3/5 sisters, cousins and aunts. No FH of thyroid cancer.  No h/o radiation tx to head or neck. No recent use of iodine supplements.  She has URIs >> gets Prednisone tapers 1-2 x a year.   ROS: Constitutional: see above, + nocturia Eyes: + blurry vision, no xerophthalmia ENT: + sore throat, no nodules palpated in throat, no dysphagia/odynophagia, + hoarseness, + tinnitus, + hypoacusis Cardiovascular: no CP/+ SOB/no palpitations/+ leg swelling Respiratory:+cough/+ SOB/+wheezing Gastrointestinal: no N/V/D/+ C Musculoskeletal: + muscle aches/+ joint aches Skin: + rash, + itching, + easy bruising,  + hair loss Neurological: no tremors/numbness/tingling/dizziness Psychiatric: + both: depression/anxiety Low libido  Past Medical History  Diagnosis Date  . Asthma dx age 29  . Pneumonia   . Vertigo   . Migraines     poss atypical ? MRI   Past Surgical History  Procedure Laterality Date  . Dental extractions    . Dental extractions     History   Social History  . Marital Status: married    Spouse Name: N/A    Number of Children: 2   Occupational History  . 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 Smoking status: Current Some Day Smoker -- 0.10 packs/day    Types: Cigarettes - 1-2 a day  . Smokeless tobacco: Not on file     Comment: has quit "lots of times"  . Alcohol Use: Once a week, 2 glasses  . 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   dog   Current Outpatient Prescriptions on File Prior to Visit  Medication Sig Dispense Refill  . albuterol (PROAIR HFA) 108 (90 BASE) MCG/ACT inhaler Inhale 2 puffs into the lungs every 4 (four) hours as needed for wheezing or shortness of breath.  3 Inhaler  3  . cetirizine (ZYRTEC) 10 MG tablet Take 10 mg by  mouth daily.      [name] doxycycline (VIBRA-TABS) 100 MG tablet 2 today then one daily  8 tablet  0  . fluticasone (FLONASE) 50 MCG/ACT nasal spray 2 sprays in each nostril once daily as needed  48 g  3  . mometasone-formoterol (DULERA) 100-5 MCG/ACT AERO 2 puffs then rinse mouth, twice daily  3 Inhaler  3  . mometasone-formoterol (DULERA) 100-5 MCG/ACT AERO Inhale 2 puffs into the lungs 2 (two) times daily.  1 Inhaler  0   No current facility-administered medications on file prior to visit.   Allergies  Allergen Reactions  . Meloxicam     REACTION: dizziness  . Penicillins   . Sulfonamide Derivatives    Family History  Problem Relation Age of Onset  . Asthma Mother   . Breast cancer Mother   . Asthma Sister     smoker  . Asthma Sister      smoker  . Breast cancer Sister   . Ovarian cancer Sister   . ADD / ADHD Son    PE: BP 138/92  Pulse 88  Temp(Src) 98.5 F (36.9 C) (Oral)  Resp 12  Ht 5' 8"  (1.727 m)  Wt 200 lb 9.6 oz (90.992 kg)  BMI 30.51 kg/m2  SpO2 96% Wt Readings from Last 3 Encounters:  04/21/13 200 lb 9.6 oz (90.992 kg)  02/23/13 200 lb (90.719 kg)  09/05/12 200 lb 12.8 oz (91.082 kg)   Constitutional: overweight, in NAD Eyes: PERRLA, EOMI, no exophthalmos ENT: moist mucous membranes, no thyromegaly, no cervical lymphadenopathy Cardiovascular: RRR, No MRG Respiratory: CTA B Gastrointestinal: abdomen soft, NT, ND, BS+ Musculoskeletal: no deformities, strength intact in all 4 Skin: moist, warm, no rashes Neurological: no tremor with outstretched hands, DTR normal in all 4  ASSESSMENT: 1. Hypothyroidism  PLAN:  1. Patient with FH of hypothyroidism, but with normal TFTs 9 mo ago - we'll check thyroid tests today: TSH, free T4, free T3, antiTPO Abx - discussed about the different tests that we have available for hypothyroidism and normal ranges  2. Fatigue - discussed that there are many possible reasons for fatigue besides hypothyroidism - will check the following tests: Orders Placed This Encounter  Procedures  . TSH  . T4, free  . T3, free  . B12  . Vitamin D (25 hydroxy)  . Sed Rate (ESR)  . Thyroid Peroxidase Antibody   Office Visit on 04/21/2013  Component Date Value Range Status  . TSH 04/21/2013 0.65  0.35 - 5.50 uIU/mL Final  . Free T4 04/21/2013 0.98  0.60 - 1.60 ng/dL Final  . T3, Free 04/21/2013 2.7  2.3 - 4.2 pg/mL Final  . Vitamin B-12 04/21/2013 289  211 - 911 pg/mL Final  . Vit D, 25-Hydroxy 04/21/2013 23* 30 - 89 ng/mL Final   Comment: This assay accurately quantifies Vitamin D, which is the sum of the                          25-Hydroxy forms of Vitamin D2 and D3.  Studies have shown that the                          optimum concentration of 25-Hydroxy Vitamin D is 30  ng/mL or higher.                           Concentrations of Vitamin D  between 20 and 29 ng/mL are considered to                          be insufficient and concentrations less than 20 ng/mL are considered                          to be deficient for Vitamin D.  . Sed Rate 04/21/2013 27* 0 - 22 mm/hr Final  . Thyroid Peroxidase Antibody 04/21/2013 <10.0  <35.0 IU/mL Final   Comment:                            The thyroid microsomal antigen has been shown to be Thyroid                          Peroxidase (TPO).  This assay detects anti-TPO antibodies.   TFTs normal. Vitamins low. ESR slightly high.  Will call in Ergocalciferol  - 50,000 IU 1x a week for 8 weeks, then continue with Cholecalciferol 2000 IU daily. Will advise to get B12 inj through PCP.

## 2013-04-21 NOTE — Patient Instructions (Signed)
Please stop at the lab. I will send you the results through [facility]. We will schedule another appointment if the results are abnormal. Otherwise, please continue to follow with Dr Regis Bill and have thyroid tests checked yearly.

## 2013-04-22 LAB — VITAMIN D 25 HYDROXY (VIT D DEFICIENCY, FRACTURES): Vit D, 25-Hydroxy: 23 ng/mL — ABNORMAL LOW (ref 30–89)

## 2013-04-24 LAB — THYROID PEROXIDASE ANTIBODY: Thyroperoxidase Ab SerPl-aCnc: <10 IU/mL (ref <35.0)

## 2013-04-26 MED ORDER — ERGOCALCIFEROL 1.25 MG (50000 UT) PO CAPS: 50000.0000 [IU] | ORAL_CAPSULE | ORAL | Status: DC

## 2013-04-28 ENCOUNTER — Telehealth: Payer: Self-pay | Admitting: Family Medicine

## 2013-04-28 DIAGNOSIS — E538 Deficiency of other specified B group vitamins: Secondary | ICD-10-CM

## 2013-04-28 DIAGNOSIS — E559 Vitamin D deficiency, unspecified: Secondary | ICD-10-CM

## 2013-04-28 NOTE — Telephone Encounter (Signed)
The patient called and stated that she had lab work ordered through Dr. Cruzita Lederer.  The results came back that she has low vitamin D and a low normal B12.  Dr. Cruzita Lederer has suggested that she take vitamin D and B12 injections.  This is found in her note.  She recommend that the patient come to PCP for B12.  Please review and advise.  Thanks!

## 2013-05-02 ENCOUNTER — Encounter: Payer: Self-pay | Admitting: Internal Medicine

## 2013-05-02 MED ORDER — PREDNISONE 10 MG PO TABS: ORAL_TABLET | ORAL | Status: DC

## 2013-05-02 NOTE — Telephone Encounter (Signed)
Lets try prednisone taper 10 mg, # 20, 4 X 2 DAYS, 3 X 2 DAYS, 2 X 2 DAYS, 1 X 2 DAYS This is being used to reduce inflammation. We want to see if it helps, and if the benefit lasts.

## 2013-05-02 NOTE — Addendum Note (Signed)
Addended by: Horatio Pel on: 05/02/2013 08:44 AM   Modules accepted: Orders

## 2013-05-07 NOTE — Telephone Encounter (Signed)
Please  Arrange further labs ;  Vit N81, folic acid, Methylmalonic acid blood level, Intrinsic factor antibodies ,  celiac 10 panel . DX fatigue , vit d  Deficiency, low b12 level  Then OV to discuss results  and arrange  Appropriate therapy

## 2013-05-08 ENCOUNTER — Other Ambulatory Visit: Payer: Self-pay | Admitting: Family Medicine

## 2013-05-08 DIAGNOSIS — E559 Vitamin D deficiency, unspecified: Secondary | ICD-10-CM

## 2013-05-08 DIAGNOSIS — R5381 Other malaise: Secondary | ICD-10-CM

## 2013-05-08 DIAGNOSIS — R5383 Other fatigue: Principal | ICD-10-CM

## 2013-05-08 DIAGNOSIS — E538 Deficiency of other specified B group vitamins: Secondary | ICD-10-CM

## 2013-05-08 NOTE — Telephone Encounter (Addendum)
Lm for pt to cb and sche

## 2013-05-08 NOTE — Telephone Encounter (Signed)
I have placed the orders in the system Please call the pt and make lab appt Then make follow up appt with Dr. Regis Bill at least one week after labs.

## 2013-05-09 NOTE — Telephone Encounter (Signed)
Pt states she saw dr Regis Bill last April for this issue.  Pt followed up w/ dr Renne Crigler b/c she wasn't getting better. Pt states notes are in the computer and was told that there would be no problem w/ calling and scheduling her b12 inj here.  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

## 2013-05-09 NOTE — Telephone Encounter (Signed)
Pt states she already had these labs and was told by dr Cruzita Lederer to sch the b12 here at PCP. Pt states she does not need to have the labs done again. She only needs the b12.  OK to schedule this?

## 2013-05-09 NOTE — Telephone Encounter (Signed)
I need to discern the issue about the b12 and why injection vs oral needed.and why does she have this problem to begin with  There are are a number of issues related to the dx of b12 problems . autoimmunie  Gi etc.

## 2013-05-09 NOTE — Telephone Encounter (Signed)
Per [facility], she may come in for OV first before any further lab testing.  Please make appt.  Thanks!

## 2013-05-10 ENCOUNTER — Ambulatory Visit: Payer: 59 | Admitting: Family Medicine

## 2013-05-10 NOTE — Telephone Encounter (Signed)
Called patient  She is on high dose vit d and finishing and to go to otc .transition.    The next step was to see rheum if b 12 and vit didn't help. Didn't think she had PA. Will have her take oral b12 1000  Per day oral or dissolvable in add to D  Vit d and b12 level in about 6 weeks   Then go from there to decide on next step . Depending on how she is feeling.  Will hold on te other lab tests at this time as well as OV   Misty please make a note of plan . I put in separate orders for just b12 and vit d

## 2013-05-10 NOTE — Telephone Encounter (Signed)
Will call her later   After clinic meeting hours

## 2013-05-11 ENCOUNTER — Other Ambulatory Visit: Payer: Self-pay | Admitting: Family Medicine

## 2013-05-11 NOTE — Telephone Encounter (Signed)
lmom for pt to return my call. 

## 2013-05-11 NOTE — Telephone Encounter (Signed)
Elizabeth Pratt, please make lab appt in 6 weeks with the pt.  Thanks!

## 2013-05-25 NOTE — Telephone Encounter (Signed)
lmom for pt to CB °

## 2014-02-05 ENCOUNTER — Ambulatory Visit (INDEPENDENT_AMBULATORY_CARE_PROVIDER_SITE_OTHER): Payer: 59 | Admitting: Internal Medicine

## 2014-02-05 ENCOUNTER — Encounter: Payer: Self-pay | Admitting: Internal Medicine

## 2014-02-05 ENCOUNTER — Ambulatory Visit (INDEPENDENT_AMBULATORY_CARE_PROVIDER_SITE_OTHER): Payer: 59 | Admitting: *Deleted

## 2014-02-05 VITALS — BP 120/82 | HR 59 | Ht 66.0 in | Wt 190.0 lb

## 2014-02-05 DIAGNOSIS — Z720 Tobacco use: Secondary | ICD-10-CM

## 2014-02-05 DIAGNOSIS — Z23 Encounter for immunization: Secondary | ICD-10-CM

## 2014-02-05 DIAGNOSIS — F17200 Nicotine dependence, unspecified, uncomplicated: Secondary | ICD-10-CM

## 2014-02-05 DIAGNOSIS — J4530 Mild persistent asthma, uncomplicated: Secondary | ICD-10-CM

## 2014-02-05 MED ORDER — ALBUTEROL SULFATE HFA 108 (90 BASE) MCG/ACT IN AERS: 2.0000 | INHALATION_SPRAY | RESPIRATORY_TRACT | Status: DC | PRN

## 2014-02-05 NOTE — Assessment & Plan Note (Signed)
Not trying currently

## 2014-02-05 NOTE — Assessment & Plan Note (Signed)
Currently well controlled. Plan- refill discount card for Proair, smoking cessation emphasized, flu shot

## 2014-02-05 NOTE — Progress Notes (Signed)
Patient ID: Elizabeth Pratt, female    DOB: 12/24/1960, 53 y.o.   MRN: [id number]  HPI 53 yo female with known hx of asthma  09/08/10 Acute OV  Pr presents for an acute office visit. Complains of prod cough with "clear, slimy" mucus, using resue q2h, fever up to 101, wheezing, increased SOB x3days.  OTC meds not helping. Started with nasal drip and drainage. Wheezing is getting worse.  Cough is keeping her up at night.   05/11/11- 11 yoF former smoker (Dec, 2012) followed for asthma, bronchitis Seen here June 4 by the nurse practitioner. I last saw her 05/09/10. She quit smoking on her own 6 or 7 weeks ago and was not tempted when her smoking sister visited. She has a productive cough with clear phlegm. Using her rescue inhaler about twice a day. Is not as effective/lasting as she wishes. Continues Qvar 40. Change to Zyrtec to Claritin. Dyspnea on exertion with activity much more vigorous than usual ADLs. Denies chest pain, nodes, blood.  PFT 04/30/08- FEV1 2.79/99%, FEV1/FVC 0.75, significant response to bronchodilator, TLC 94%, DLCO 109%.  09/07/11- 74 yoF former smoker (Dec, 2012) followed for asthma, bronchitis Completed prednisone taper-feels better; currently using Mucinex as well to help clear mucus. Had not taken Symbicort due to changes in pharmacies but will transfer asap. Smoking again, blaming "stress". She had called reporting increased bronchitis with cough 2 weeks ago when we had called in prednisone which helped. She just started Mucinex 2 days ago. CXR 05/13/11-  IMPRESSION:  No active cardiopulmonary disease.  Original Report Authenticated By: Jamas Lav, M.D.   03/07/12- 38 yoF smoker followed for asthma, bronchitis FOLLOWS FOR: breathing has improved since last OV. Denies chest pain, tightness or increased SOB. [name] has had flu vaccine. Smoking on and off and says she "quit" again 2 weeks ago. I strongly encouraged her to stay off permanently, emphasizing inpact on her breathing.  We discussed recommended use of Symbicort. Did not get CXR last year because of insurance issue 09/05/12- 89 yoF smoker followed for asthma, bronchitis FOLLOWS FOR: Doing well even through the allergy season Smokes intermittently- less than in past. We discussed this again. Good spring season. Less exposure to school-age children and their colds.  02/23/13- 70 yoF smoker followed for asthma, bronchitis Pt c/o prod cough with clear mucus. She may be smoking less currently but she has not made a real effort to quit now. Recent upper respiratory infection with residual thick mucus, nonpurulent. No fever or sore throat. She feels this is taking long to clear. CXR 05/11/11 IMPRESSION:  No active cardiopulmonary disease.  Original Report Authenticated By: Jamas Lav, M.D.  02/05/14- 52 yoF smoker followed for asthma, bronchitis FOLLOWS DGL:OVFIEPP flu shot as she is due for hand procedure soon; would like to get a sample of Proair if we have one-moving things in house and lost hers as well as insurance will not pay for oit yet. Pt states her breathing is doing well-slightly hoarse(out of Zyrtec) otherwise doing well.  Had cold resolved w/o assistance.  Pending trigger finger surgery- discussed timing of flu shot  ROS-see HPI Constitutional:   No-   weight loss, night sweats, fevers, chills, fatigue, lassitude. HEENT:   No-  headaches, difficulty swallowing, tooth/dental problems, sore throat,       No-  sneezing, itching, ear ache, + some nasal congestion, +post nasal drip,  CV:  No-   chest pain, orthopnea, PND, swelling in lower extremities, anasarca, dizziness,  palpitations Resp: +shortness of breath with  + exertion not at rest.          +No-productive cough,  No non-productive cough,  No- coughing up of blood.              No-   change in color of mucus.  No-wheezing.   Skin: No-   rash or lesions. GI:  No-   heartburn, indigestion, abdominal pain, nausea, vomiting,  GU: , MS:  No-    joint pain or swelling.  . Neuro-     nothing unusual Psych:  No- change in mood or affect. No depression or anxiety.  No memory loss.  Objective:  OBJ- Physical Exam- unremarkable General- Alert, Oriented, Affect-appropriate, Distress- none acute, overweight Skin- rash-none, lesions- none, excoriation- none Lymphadenopathy- none Head- atraumatic            Eyes- Gross vision intact, PERRLA, conjunctivae and secretions clear            Ears- Hearing, canals-normal            Nose-  no-Septal dev, mucus+, no-polyps, erosion, perforation             Throat- Mallampati II , mucosa clear , drainage- none, tonsils- atrophic Neck- flexible , trachea midline, no stridor , thyroid nl, carotid no bruit Chest - symmetrical excursion , unlabored           Heart/CV- RRR , no murmur , no gallop  , no rub, nl s1 s2                           - JVD- none , edema- none, stasis changes- none, varices- none           Lung- clear, cough-none , dullness-none, rub- none           Chest wall-  Abd- Br/ Gen/ Rectal- Not done, not indicated Extrem- cyanosis- none, clubbing, none, atrophy- none, strength- nl Neuro- grossly intact to observation

## 2014-02-05 NOTE — Patient Instructions (Signed)
We will try to help you check to see that your insurance will cover CXR here if needed in the future  Script printed for the Proair card  Flu vax  Please keep trying to stop smoking.   I hope the house fixing goes well

## 2014-03-16 ENCOUNTER — Other Ambulatory Visit: Payer: Self-pay | Admitting: Internal Medicine

## 2014-03-25 ENCOUNTER — Encounter (HOSPITAL_COMMUNITY): Payer: Self-pay | Admitting: Nurse Practitioner

## 2014-03-25 ENCOUNTER — Emergency Department (HOSPITAL_COMMUNITY): Payer: 59

## 2014-03-25 ENCOUNTER — Inpatient Hospital Stay (HOSPITAL_COMMUNITY)
Admission: EM | Admit: 2014-03-25 | Discharge: 2014-03-26 | DRG: 392 | Disposition: A | Discharge status: Home / Self Care | Payer: 59 | Attending: Internal Medicine | Admitting: Internal Medicine

## 2014-03-25 DIAGNOSIS — Z880 Allergy status to penicillin: Secondary | ICD-10-CM

## 2014-03-25 DIAGNOSIS — J209 Acute bronchitis, unspecified: Secondary | ICD-10-CM | POA: Diagnosis present

## 2014-03-25 DIAGNOSIS — K760 Fatty (change of) liver, not elsewhere classified: Secondary | ICD-10-CM | POA: Diagnosis present

## 2014-03-25 DIAGNOSIS — G43909 Migraine, unspecified, not intractable, without status migrainosus: Secondary | ICD-10-CM | POA: Diagnosis present

## 2014-03-25 DIAGNOSIS — R109 Unspecified abdominal pain: Secondary | ICD-10-CM

## 2014-03-25 DIAGNOSIS — K5732 Diverticulitis of large intestine without perforation or abscess without bleeding: Principal | ICD-10-CM | POA: Diagnosis present

## 2014-03-25 DIAGNOSIS — K5792 Diverticulitis of intestine, part unspecified, without perforation or abscess without bleeding: Secondary | ICD-10-CM | POA: Insufficient documentation

## 2014-03-25 DIAGNOSIS — E669 Obesity, unspecified: Secondary | ICD-10-CM | POA: Diagnosis present

## 2014-03-25 DIAGNOSIS — J45909 Unspecified asthma, uncomplicated: Secondary | ICD-10-CM | POA: Diagnosis present

## 2014-03-25 DIAGNOSIS — Z882 Allergy status to sulfonamides status: Secondary | ICD-10-CM | POA: Diagnosis not present

## 2014-03-25 DIAGNOSIS — R112 Nausea with vomiting, unspecified: Secondary | ICD-10-CM

## 2014-03-25 DIAGNOSIS — K5733 Diverticulitis of large intestine without perforation or abscess with bleeding: Secondary | ICD-10-CM

## 2014-03-25 DIAGNOSIS — Z6830 Body mass index (BMI) 30.0-30.9, adult: Secondary | ICD-10-CM | POA: Diagnosis not present

## 2014-03-25 DIAGNOSIS — F17210 Nicotine dependence, cigarettes, uncomplicated: Secondary | ICD-10-CM | POA: Diagnosis present

## 2014-03-25 DIAGNOSIS — R1032 Left lower quadrant pain: Secondary | ICD-10-CM | POA: Diagnosis present

## 2014-03-25 DIAGNOSIS — Z720 Tobacco use: Secondary | ICD-10-CM | POA: Diagnosis present

## 2014-03-25 DIAGNOSIS — N200 Calculus of kidney: Secondary | ICD-10-CM

## 2014-03-25 LAB — URINALYSIS, ROUTINE W REFLEX MICROSCOPIC
Bilirubin Urine: NEGATIVE
GLUCOSE, UA: NEGATIVE mg/dL
HGB URINE DIPSTICK: NEGATIVE
Ketones, ur: 15 mg/dL — AB
Nitrite: NEGATIVE
PROTEIN: NEGATIVE mg/dL
Specific Gravity, Urine: 1.025 (ref 1.005–1.030)
Urobilinogen, UA: 0.2 mg/dL (ref 0.0–1.0)
pH: 8 (ref 5.0–8.0)

## 2014-03-25 LAB — COMPREHENSIVE METABOLIC PANEL
ALBUMIN: 3.7 g/dL (ref 3.5–5.2)
ALK PHOS: 68 U/L (ref 39–117)
ALT: 17 U/L (ref 0–35)
ANION GAP: 13 (ref 5–15)
AST: 16 U/L (ref 0–37)
BILIRUBIN TOTAL: 0.5 mg/dL (ref 0.3–1.2)
BUN: 10 mg/dL (ref 6–23)
CHLORIDE: 102 meq/L (ref 96–112)
CO2: 24 meq/L (ref 19–32)
CREATININE: 0.62 mg/dL (ref 0.50–1.10)
Calcium: 9.3 mg/dL (ref 8.4–10.5)
GFR calc Af Amer: >90 mL/min (ref >90)
GLUCOSE: 94 mg/dL (ref 70–99)
POTASSIUM: 4 meq/L (ref 3.7–5.3)
Sodium: 139 mEq/L (ref 137–147)
Total Protein: 7 g/dL (ref 6.0–8.3)

## 2014-03-25 LAB — URINE MICROSCOPIC-ADD ON

## 2014-03-25 LAB — CBC
HEMATOCRIT: 42.2 % (ref 36.0–46.0)
HEMOGLOBIN: 14 g/dL (ref 12.0–15.0)
MCH: 29.3 pg (ref 26.0–34.0)
MCHC: 33.2 g/dL (ref 30.0–36.0)
MCV: 88.3 fL (ref 78.0–100.0)
Platelets: 237 10*3/uL (ref 150–400)
RBC: 4.78 MIL/uL (ref 3.87–5.11)
RDW: 12.8 % (ref 11.5–15.5)
WBC: 9.3 10*3/uL (ref 4.0–10.5)

## 2014-03-25 LAB — PREGNANCY, URINE: PREG TEST UR: NEGATIVE

## 2014-03-25 MED ORDER — PROMETHAZINE HCL 25 MG/ML IJ SOLN
25.0000 mg | Freq: Once | INTRAMUSCULAR | Status: AC
  Administered 2014-03-25: 25 mg via INTRAVENOUS
  Filled 2014-03-25: qty 1

## 2014-03-25 MED ORDER — METRONIDAZOLE IN NACL 5-0.79 MG/ML-% IV SOLN
500.0000 mg | Freq: Once | INTRAVENOUS | Status: AC
  Administered 2014-03-25: 500 mg via INTRAVENOUS
  Filled 2014-03-25: qty 100

## 2014-03-25 MED ORDER — ENOXAPARIN SODIUM 40 MG/0.4ML ~~LOC~~ SOLN
40.0000 mg | SUBCUTANEOUS | Status: DC
  Administered 2014-03-25: 40 mg via SUBCUTANEOUS
  Filled 2014-03-25 (×2): qty 0.4

## 2014-03-25 MED ORDER — MORPHINE SULFATE 2 MG/ML IJ SOLN: 2.0000 mg | INTRAMUSCULAR | Status: DC | PRN

## 2014-03-25 MED ORDER — IOHEXOL 300 MG/ML  SOLN
25.0000 mL | Freq: Once | INTRAMUSCULAR | Status: AC | PRN
  Administered 2014-03-25: 25 mL via ORAL

## 2014-03-25 MED ORDER — SODIUM CHLORIDE 0.9 % IV BOLUS (SEPSIS): 1000.0000 mL | Freq: Once | INTRAVENOUS | Status: DC

## 2014-03-25 MED ORDER — MORPHINE SULFATE 4 MG/ML IJ SOLN
4.0000 mg | Freq: Once | INTRAMUSCULAR | Status: AC
Start: 2014-03-25 — End: 2014-03-25
  Administered 2014-03-25: 4 mg via INTRAVENOUS
  Filled 2014-03-25: qty 1

## 2014-03-25 MED ORDER — ONDANSETRON HCL 4 MG/2ML IJ SOLN
4.0000 mg | Freq: Once | INTRAMUSCULAR | Status: AC
  Administered 2014-03-25: 4 mg via INTRAVENOUS

## 2014-03-25 MED ORDER — SODIUM CHLORIDE 0.9 % IV BOLUS (SEPSIS)
1000.0000 mL | Freq: Once | INTRAVENOUS | Status: AC
  Administered 2014-03-25: 1000 mL via INTRAVENOUS

## 2014-03-25 MED ORDER — CIPROFLOXACIN IN D5W 400 MG/200ML IV SOLN
400.0000 mg | Freq: Two times a day (BID) | INTRAVENOUS | Status: DC
  Administered 2014-03-26: 400 mg via INTRAVENOUS
  Filled 2014-03-25 (×4): qty 200

## 2014-03-25 MED ORDER — OXYCODONE HCL 5 MG PO TABS
5.0000 mg | ORAL_TABLET | ORAL | Status: DC | PRN
  Administered 2014-03-26: 5 mg via ORAL
  Filled 2014-03-25: qty 1

## 2014-03-25 MED ORDER — IOHEXOL 300 MG/ML  SOLN
100.0000 mL | Freq: Once | INTRAMUSCULAR | Status: AC | PRN
  Administered 2014-03-25: 100 mL via INTRAVENOUS

## 2014-03-25 MED ORDER — METRONIDAZOLE IN NACL 5-0.79 MG/ML-% IV SOLN
500.0000 mg | Freq: Three times a day (TID) | INTRAVENOUS | Status: DC
  Administered 2014-03-26 (×2): 500 mg via INTRAVENOUS
  Filled 2014-03-25 (×6): qty 100

## 2014-03-25 MED ORDER — FLUTICASONE PROPIONATE 50 MCG/ACT NA SUSP: 2.0000 | Freq: Every day | NASAL | Status: DC | PRN

## 2014-03-25 MED ORDER — ONDANSETRON HCL 4 MG/2ML IJ SOLN
4.0000 mg | Freq: Once | INTRAMUSCULAR | Status: AC
  Administered 2014-03-25: 4 mg via INTRAVENOUS
  Filled 2014-03-25: qty 2

## 2014-03-25 MED ORDER — CIPROFLOXACIN IN D5W 400 MG/200ML IV SOLN
400.0000 mg | Freq: Once | INTRAVENOUS | Status: AC
  Administered 2014-03-25: 400 mg via INTRAVENOUS
  Filled 2014-03-25: qty 200

## 2014-03-25 MED ORDER — MORPHINE SULFATE 4 MG/ML IJ SOLN: 4.0000 mg | Freq: Once | INTRAMUSCULAR | Status: DC

## 2014-03-25 MED ORDER — LORATADINE 10 MG PO TABS
10.0000 mg | ORAL_TABLET | Freq: Every day | ORAL | Status: DC
  Filled 2014-03-25 (×2): qty 1

## 2014-03-25 MED ORDER — ACETAMINOPHEN 650 MG RE SUPP: 650.0000 mg | Freq: Four times a day (QID) | RECTAL | Status: DC | PRN

## 2014-03-25 MED ORDER — ONDANSETRON HCL 4 MG/2ML IJ SOLN: 4.0000 mg | Freq: Once | INTRAMUSCULAR | Status: DC

## 2014-03-25 MED ORDER — ALBUTEROL SULFATE (2.5 MG/3ML) 0.083% IN NEBU: 3.0000 mL | INHALATION_SOLUTION | Freq: Four times a day (QID) | RESPIRATORY_TRACT | Status: DC | PRN

## 2014-03-25 MED ORDER — SODIUM CHLORIDE 0.9 % IV SOLN
INTRAVENOUS | Status: DC
  Administered 2014-03-25: 20:00:00 via INTRAVENOUS

## 2014-03-25 MED ORDER — HYDROMORPHONE HCL 1 MG/ML IJ SOLN
1.0000 mg | Freq: Once | INTRAMUSCULAR | Status: AC
  Administered 2014-03-25: 1 mg via INTRAVENOUS
  Filled 2014-03-25: qty 1

## 2014-03-25 MED ORDER — MOMETASONE FURO-FORMOTEROL FUM 100-5 MCG/ACT IN AERO
2.0000 | INHALATION_SPRAY | Freq: Two times a day (BID) | RESPIRATORY_TRACT | Status: DC
  Filled 2014-03-25 (×2): qty 8.8

## 2014-03-25 MED ORDER — ONDANSETRON HCL 4 MG/2ML IJ SOLN: 4.0000 mg | Freq: Four times a day (QID) | INTRAMUSCULAR | Status: DC | PRN

## 2014-03-25 MED ORDER — ACETAMINOPHEN 325 MG PO TABS
650.0000 mg | ORAL_TABLET | Freq: Four times a day (QID) | ORAL | Status: DC | PRN
  Administered 2014-03-26: 650 mg via ORAL
  Filled 2014-03-25: qty 2

## 2014-03-25 MED ORDER — SODIUM CHLORIDE 0.9 % IV SOLN
INTRAVENOUS | Status: DC
  Administered 2014-03-26: 08:00:00 via INTRAVENOUS

## 2014-03-25 MED ORDER — ONDANSETRON HCL 4 MG PO TABS: 4.0000 mg | ORAL_TABLET | Freq: Four times a day (QID) | ORAL | Status: DC | PRN

## 2014-03-25 NOTE — ED Provider Notes (Signed)
Patient care acquired from [facility], PA-C pending CT scan results.   Results for orders placed or performed during the hospital encounter of 03/25/14  U/A (may I&O cath if menses)  Result Value Ref Range   Color, Urine YELLOW YELLOW   APPearance TURBID (A) CLEAR   Specific Gravity, Urine 1.025 1.005 - 1.030   pH 8.0 5.0 - 8.0   Glucose, UA NEGATIVE NEGATIVE mg/dL   Hgb urine dipstick NEGATIVE NEGATIVE   Bilirubin Urine NEGATIVE NEGATIVE   Ketones, ur 15 (A) NEGATIVE mg/dL   Protein, ur NEGATIVE NEGATIVE mg/dL   Urobilinogen, UA 0.2 0.0 - 1.0 mg/dL   Nitrite NEGATIVE NEGATIVE   Leukocytes, UA MODERATE (A) NEGATIVE  CBC (if pt has a temp above 100.35F)  Result Value Ref Range   WBC 9.3 4.0 - 10.5 K/uL   RBC 4.78 3.87 - 5.11 MIL/uL   Hemoglobin 14.0 12.0 - 15.0 g/dL   HCT 42.2 36.0 - 46.0 %   MCV 88.3 78.0 - 100.0 fL   MCH 29.3 26.0 - 34.0 pg   MCHC 33.2 30.0 - 36.0 g/dL   RDW 12.8 11.5 - 15.5 %   Platelets 237 150 - 400 K/uL  Comprehensive metabolic panel (if pt has a temp above 100.35F)  Result Value Ref Range   Sodium 139 137 - 147 mEq/L   Potassium 4.0 3.7 - 5.3 mEq/L   Chloride 102 96 - 112 mEq/L   CO2 24 19 - 32 mEq/L   Glucose, Bld 94 70 - 99 mg/dL   BUN 10 6 - 23 mg/dL   Creatinine, Ser 0.62 0.50 - 1.10 mg/dL   Calcium 9.3 8.4 - 10.5 mg/dL   Total Protein 7.0 6.0 - 8.3 g/dL   Albumin 3.7 3.5 - 5.2 g/dL   AST 16 0 - 37 U/L   ALT 17 0 - 35 U/L   Alkaline Phosphatase 68 39 - 117 U/L   Total Bilirubin 0.5 0.3 - 1.2 mg/dL   GFR calc non Af Amer >90 >90 mL/min   GFR calc Af Amer >90 >90 mL/min   Anion gap 13 5 - 15  Pregnancy, urine  Result Value Ref Range   Preg Test, Ur NEGATIVE NEGATIVE  Urine microscopic-add on  Result Value Ref Range   Squamous Epithelial / LPF FEW (A) RARE   WBC, UA 3-6 <3 WBC/hpf   Bacteria, UA FEW (A) RARE   Urine-Other MUCOUS PRESENT    Ct Abdomen Pelvis W Contrast  03/25/2014   CLINICAL DATA:  Patient with left lower quadrant  pain for 2 days.  EXAM: CT ABDOMEN AND PELVIS WITH CONTRAST  TECHNIQUE: Multidetector CT imaging of the abdomen and pelvis was performed using the standard protocol following bolus administration of intravenous contrast.  CONTRAST:  161mL OMNIPAQUE IOHEXOL 300 MG/ML  SOLN  COMPARISON:  None  FINDINGS: Visualization of the lower thorax demonstrates no consolidative or nodular pulmonary opacities. Normal heart size.  The liver is normal in size and contour. There are 2 subcentimeter low-attenuation lesions within the liver, too small to accurately characterize. Fatty deposition adjacent to the falciform ligament. The gallbladder is unremarkable. No intrahepatic or extrahepatic biliary ductal dilatation. The spleen is unremarkable. Pancreas is unremarkable. Normal bilateral adrenal glands.  There is a 2 mm nonobstructing stone within the interpolar region of the right kidney. The kidneys enhance symmetrically with contrast. No hydronephrosis.  Normal caliber abdominal aorta. No retroperitoneal lymphadenopathy. Urinary bladder is unremarkable.  Uterus is unremarkable.  Sigmoid colonic  diverticulosis. There is marked circumferential wall thickening of the sigmoid colon with surrounding fat stranding and fluid. No evidence for free intraperitoneal air.  No evidence for bowel obstruction.  No aggressive or acute appearing osseous lesions.  IMPRESSION: Findings most compatible with marked sigmoid colonic diverticulitis. If not previously performed, recommend correlation with colonoscopy upon resolution of the acute symptomatology to exclude underlying mass.  Nonobstructing stone interpolar region right kidney.   Electronically Signed   By: Lovey Newcomer M.D.   On: 03/25/2014 16:46    1. Diverticulitis large intestine w/o perforation or abscess w/bleeding   2. Abdominal pain   3. Non-intractable vomiting with nausea, vomiting of unspecified type    On reevaluation abdomen is soft, tender in lower quadrants without  guarding or rigidity. Pain medication ordered. Discussed CT results with patient, IV Cipro and Flagyl ordered for diverticulitis. Attempted to fluid challenge patient 3 times, patient is unable to tolerate by mouth intake. Will admit patient for IV fluids, pain control and evaluation of diverticulitis to [facility].   Harlow Mares, PA-C 03/25/14 2236  Evelina Bucy, MD 03/26/14 [phone]

## 2014-03-25 NOTE — ED Notes (Signed)
Pt attempted to drink a few sips of water and was unable to keep them down at this time.

## 2014-03-25 NOTE — ED Notes (Signed)
Pt returned from CT scan.

## 2014-03-25 NOTE — H&P (Signed)
[facility] History and Physical  Patient: Elizabeth Pratt  LNL:[id number]  DOB: 11/09/1960  DOS: the patient was seen and examined on 03/25/2014 PCP: Lottie Dawson, MD  Chief Complaint: Lower abdominal pain  HPI: Elizabeth Pratt is a 53 y.o. female with Past medical history of asthma. Patient presents with complaints of lower abdominal pain. This has been started last night on 03/24/2014. She mentions she was at her baseline and did not have any pain on and off prior to this onset. Last night she started having the results severity tends pelvic pain and the pain was sharp crampy pain and was worsening on and off. She felt some pressure with urination. She has been passing gas and a since the pain she felt was associated with bowel movement and she felt that she was constipated she took some milk of magnesia with that she had a few watery bowel movement but no relief in her pain and therefore she decided to come to the hospital as her pain was progressively worsening throughout the day. She had an episode of nausea but no vomiting. No fever no chills reported. She has not had any colonoscopy yet  The patient is coming from home And at her baseline independent for most of her ADL.  Review of Systems: as mentioned in the history of present illness.  A Comprehensive review of the other systems is negative.  Past Medical History  Diagnosis Date  . Asthma dx age 45  . Pneumonia   . Vertigo   . Migraines     poss atypical ? MRI   Past Surgical History  Procedure Laterality Date  . Dental extractions    . Dental extractions     Social History:  reports that she has been smoking Cigarettes.  She has been smoking about 0.10 packs per day. She does not have any smokeless tobacco history on file. She reports that she drinks alcohol. She reports that she does not use illicit drugs.  Allergies  Allergen Reactions  . Meloxicam Other (See Comments)    REACTION: dizziness  .  Penicillins Other (See Comments)    Reaction Unknown: runs in family  . Sulfonamide Derivatives Nausea And Vomiting    Family History  Problem Relation Age of Onset  . Asthma Mother   . Breast cancer Mother   . Asthma Sister     smoker  . Asthma Sister     smoker  . Breast cancer Sister   . Ovarian cancer Sister   . ADD / ADHD Son     Prior to Admission medications   Medication Sig Start Date End Date Taking? Authorizing Provider  albuterol (PROVENTIL HFA;VENTOLIN HFA) 108 (90 BASE) MCG/ACT inhaler Inhale 1-2 puffs into the lungs every 6 (six) hours as needed for wheezing or shortness of breath.   Yes Historical Provider, MD  cetirizine (ZYRTEC) 10 MG tablet Take 10 mg by mouth daily.   Yes Historical Provider, MD  cholecalciferol (VITAMIN D) 1000 UNITS tablet Take 1,000 Units by mouth daily.   Yes Historical Provider, MD  Cyanocobalamin (B-12) 1000 MCG CAPS Take by mouth.   Yes Historical Provider, MD  fluticasone (FLONASE) 50 MCG/ACT nasal spray 2 sprays in each nostril once daily as needed Patient taking differently: Place 2 sprays into both nostrils daily as needed for allergies or rhinitis.  02/23/13  Yes Deneise Lever, MD  mometasone-formoterol (DULERA) 100-5 MCG/ACT AERO 2 puffs then rinse mouth, twice daily Patient taking differently: Inhale 2 puffs into the  lungs 2 (two) times daily.  02/23/13  Yes Deneise Lever, MD  PROAIR HFA [phone] BASE) MCG/ACT inhaler INHALE 2 PUFFS EVERY 4 HOURS AS NEEDED FOR WHEEZING OR SHORTNESS OF BREATH 03/19/14  Yes Deneise Lever, MD    Physical Exam: Filed Vitals:   03/25/14 1800 03/25/14 1900 03/25/14 1933 03/25/14 2049  BP: 119/99 116/61 111/63 110/63  Pulse: 81 72 69 72  Temp:    98.6 F (37 C)  TempSrc:    Oral  Resp:   20 19  Height:    5\' 8"  (1.727 m)  Weight:    92.806 kg (204 lb 9.6 oz)  SpO2: 95% 96% 98% 97%    General: Alert, Awake and Oriented to Time, Place and Person. Appear in mild distress Eyes: PERRL ENT: Oral  Mucosa clear moist. Neck: no JVD Cardiovascular: S1 and S2 Present, no Murmur, Peripheral Pulses Present Respiratory: Bilateral Air entry equal and Decreased, Clear to Auscultation, noCrackles, no wheezes Abdomen: Bowel Sound sluggish, Soft and mildly tender Skin: no Rash Extremities: no Pedal edema, no calf tenderness Neurologic: Grossly no focal neuro deficit.  Labs on Admission:  CBC:  Recent Labs Lab 03/25/14 1353  WBC 9.3  HGB 14.0  HCT 42.2  MCV 88.3  PLT 237    CMP     Component Value Date/Time   NA 139 03/25/2014 1353   K 4.0 03/25/2014 1353   CL 102 03/25/2014 1353   CO2 24 03/25/2014 1353   GLUCOSE 94 03/25/2014 1353   BUN 10 03/25/2014 1353   CREATININE 0.62 03/25/2014 1353   CALCIUM 9.3 03/25/2014 1353   PROT 7.0 03/25/2014 1353   ALBUMIN 3.7 03/25/2014 1353   AST 16 03/25/2014 1353   ALT 17 03/25/2014 1353   ALKPHOS 68 03/25/2014 1353   BILITOT 0.5 03/25/2014 1353   GFRNONAA >90 03/25/2014 1353   GFRAA >90 03/25/2014 1353    No results for input(s): LIPASE, AMYLASE in the last 168 hours. No results for input(s): AMMONIA in the last 168 hours.  No results for input(s): CKTOTAL, CKMB, CKMBINDEX, TROPONINI in the last 168 hours. BNP (last 3 results) No results for input(s): PROBNP in the last 8760 hours.  Radiological Exams on Admission: Ct Abdomen Pelvis W Contrast  03/25/2014   CLINICAL DATA:  Patient with left lower quadrant pain for 2 days.  EXAM: CT ABDOMEN AND PELVIS WITH CONTRAST  TECHNIQUE: Multidetector CT imaging of the abdomen and pelvis was performed using the standard protocol following bolus administration of intravenous contrast.  CONTRAST:  110mL OMNIPAQUE IOHEXOL 300 MG/ML  SOLN  COMPARISON:  None  FINDINGS: Visualization of the lower thorax demonstrates no consolidative or nodular pulmonary opacities. Normal heart size.  The liver is normal in size and contour. There are 2 subcentimeter low-attenuation lesions within the liver, too  small to accurately characterize. Fatty deposition adjacent to the falciform ligament. The gallbladder is unremarkable. No intrahepatic or extrahepatic biliary ductal dilatation. The spleen is unremarkable. Pancreas is unremarkable. Normal bilateral adrenal glands.  There is a 2 mm nonobstructing stone within the interpolar region of the right kidney. The kidneys enhance symmetrically with contrast. No hydronephrosis.  Normal caliber abdominal aorta. No retroperitoneal lymphadenopathy. Urinary bladder is unremarkable.  Uterus is unremarkable.  Sigmoid colonic diverticulosis. There is marked circumferential wall thickening of the sigmoid colon with surrounding fat stranding and fluid. No evidence for free intraperitoneal air.  No evidence for bowel obstruction.  No aggressive or acute appearing osseous lesions.  IMPRESSION: Findings most compatible with marked sigmoid colonic diverticulitis. If not previously performed, recommend correlation with colonoscopy upon resolution of the acute symptomatology to exclude underlying mass.  Nonobstructing stone interpolar region right kidney.   Electronically Signed   By: Lovey Newcomer M.D.   On: 03/25/2014 16:46    Assessment/Plan Principal Problem:   Sigmoid diverticulitis Active Problems:   Asthma, chronic   1. Sigmoid diverticulitis The patient presents with lower abdominal tenderness. A CT of the abdomen shows sigmoid diverticulitis without any perforation or abscess. Patient was having significant nausea and vomiting downstairs in the ER. At the time of my evaluation her nausea has improved but not resolved. At present she will remain nothing by mouth. I will use IV Cipro and Flagyl and IV fluids. She can be transitioned to clears in the morning. Pain management as well as nausea management with medications. Patient recommended outpatient GI follow-up for a colonoscopy  2. Asthma. At present appears stable. Continue home medications.  Advance goals of  care discussion: Full code   DVT Prophylaxis: subcutaneous Heparin Nutrition: Nothing by mouth  Disposition: Admitted to inpatient in med-surge unit.  Author: Berle Mull, MD [facility] Pager: [phone] 03/25/2014, 9:23 PM    If 7PM-7AM, please contact night-coverage [email or web address] Password [email or web address]

## 2014-03-25 NOTE — ED Provider Notes (Signed)
CSN: [id number]     Arrival date & time 03/25/14  1321 History   First MD Initiated Contact with Patient 03/25/14 1333     Chief Complaint  Patient presents with  . Pelvic Pain     (Consider location/radiation/quality/duration/timing/severity/associated sxs/prior Treatment) HPI Comments: Patient is a 53 year old female with a past medical history of asthma who presents with abdominal pain that started 3 days ago. The pain is located in her lower abdomen and does not radiate. The pain is described as aching and severe. The pain started gradually and progressively worsened since the onset. No alleviating/aggravating factors. The patient has tried nothing for symptoms without relief. Associated symptoms include diarrhea. Patient denies fever, headache, nausea, vomiting, chest pain, SOB, dysuria, constipation, abnormal vaginal bleeding/discharge.      Past Medical History  Diagnosis Date  . Asthma dx age 35  . Pneumonia   . Vertigo   . Migraines     poss atypical ? MRI   Past Surgical History  Procedure Laterality Date  . Dental extractions    . Dental extractions     Family History  Problem Relation Age of Onset  . Asthma Mother   . Breast cancer Mother   . Asthma Sister     smoker  . Asthma Sister     smoker  . Breast cancer Sister   . Ovarian cancer Sister   . ADD / ADHD Son    History  Substance Use Topics  . Smoking status: Current Some Day Smoker -- 0.10 packs/day    Types: Cigarettes  . Smokeless tobacco: Not on file     Comment: has quit "lots of times"  . Alcohol Use: Yes   OB History    No data available     Review of Systems  Constitutional: Negative for fever, chills and fatigue.  HENT: Negative for trouble swallowing.   Eyes: Negative for visual disturbance.  Respiratory: Negative for shortness of breath.   Cardiovascular: Negative for chest pain and palpitations.  Gastrointestinal: Positive for abdominal pain and diarrhea. Negative for nausea and  vomiting.  Genitourinary: Negative for dysuria and difficulty urinating.  Musculoskeletal: Negative for arthralgias and neck pain.  Skin: Negative for color change.  Neurological: Negative for dizziness and weakness.  Psychiatric/Behavioral: Negative for dysphoric mood.      Allergies  Meloxicam; Penicillins; and Sulfonamide derivatives  Home Medications   Prior to Admission medications   Medication Sig Start Date End Date Taking? Authorizing Provider  albuterol (PROVENTIL HFA;VENTOLIN HFA) 108 (90 BASE) MCG/ACT inhaler Inhale 1-2 puffs into the lungs every 6 (six) hours as needed for wheezing or shortness of breath.   Yes Historical Provider, MD  cetirizine (ZYRTEC) 10 MG tablet Take 10 mg by mouth daily.   Yes Historical Provider, MD  cholecalciferol (VITAMIN D) 1000 UNITS tablet Take 1,000 Units by mouth daily.   Yes Historical Provider, MD  Cyanocobalamin (B-12) 1000 MCG CAPS Take by mouth.   Yes Historical Provider, MD  fluticasone (FLONASE) 50 MCG/ACT nasal spray 2 sprays in each nostril once daily as needed Patient taking differently: Place 2 sprays into both nostrils daily as needed for allergies or rhinitis.  02/23/13  Yes Deneise Lever, MD  mometasone-formoterol (DULERA) 100-5 MCG/ACT AERO 2 puffs then rinse mouth, twice daily Patient taking differently: Inhale 2 puffs into the lungs 2 (two) times daily.  02/23/13  Yes Deneise Lever, MD  PROAIR HFA 108 (90 BASE) MCG/ACT inhaler INHALE 2 PUFFS EVERY 4  HOURS AS NEEDED FOR WHEEZING OR SHORTNESS OF BREATH 03/19/14  Yes Deneise Lever, MD   BP 148/103 mmHg  Pulse 89  Temp(Src) 99.3 F (37.4 C) (Oral)  Resp 20  Ht 5\' 8"  (1.727 m)  Wt 190 lb (86.183 kg)  BMI 28.90 kg/m2  SpO2 94% Physical Exam  Constitutional: She is oriented to person, place, and time. She appears well-developed and well-nourished. No distress.  HENT:  Head: Normocephalic and atraumatic.  Eyes: Conjunctivae and EOM are normal.  Neck: Normal range of  motion.  Cardiovascular: Normal rate and regular rhythm.  Exam reveals no gallop and no friction rub.   No murmur heard. Pulmonary/Chest: Effort normal and breath sounds normal. She has no wheezes. She has no rales. She exhibits no tenderness.  Abdominal: Soft. She exhibits no distension. There is tenderness. There is no rebound and no guarding.  Lower abdominal tenderness to palpation more notable on the right lower abdomen. No peritoneal signs.   Musculoskeletal: Normal range of motion.  Neurological: She is alert and oriented to person, place, and time. Coordination normal.  Speech is goal-oriented. Moves limbs without ataxia.   Skin: Skin is warm and dry.  Psychiatric: She has a normal mood and affect. Her behavior is normal.  Nursing note and vitals reviewed.   ED Course  Procedures (including critical care time) Labs Review Labs Reviewed  URINALYSIS, ROUTINE W REFLEX MICROSCOPIC - Abnormal; Notable for the following:    APPearance TURBID (*)    Ketones, ur 15 (*)    Leukocytes, UA MODERATE (*)    All other components within normal limits  URINE MICROSCOPIC-ADD ON - Abnormal; Notable for the following:    Squamous Epithelial / LPF FEW (*)    Bacteria, UA FEW (*)    All other components within normal limits  COMPREHENSIVE METABOLIC PANEL - Abnormal; Notable for the following:    Albumin 3.2 (*)    All other components within normal limits  CBC  COMPREHENSIVE METABOLIC PANEL  PREGNANCY, URINE  CBC  PROTIME-INR    Imaging Review Ct Abdomen Pelvis W Contrast  03/25/2014   CLINICAL DATA:  Patient with left lower quadrant pain for 2 days.  EXAM: CT ABDOMEN AND PELVIS WITH CONTRAST  TECHNIQUE: Multidetector CT imaging of the abdomen and pelvis was performed using the standard protocol following bolus administration of intravenous contrast.  CONTRAST:  159mL OMNIPAQUE IOHEXOL 300 MG/ML  SOLN  COMPARISON:  None  FINDINGS: Visualization of the lower thorax demonstrates no  consolidative or nodular pulmonary opacities. Normal heart size.  The liver is normal in size and contour. There are 2 subcentimeter low-attenuation lesions within the liver, too small to accurately characterize. Fatty deposition adjacent to the falciform ligament. The gallbladder is unremarkable. No intrahepatic or extrahepatic biliary ductal dilatation. The spleen is unremarkable. Pancreas is unremarkable. Normal bilateral adrenal glands.  There is a 2 mm nonobstructing stone within the interpolar region of the right kidney. The kidneys enhance symmetrically with contrast. No hydronephrosis.  Normal caliber abdominal aorta. No retroperitoneal lymphadenopathy. Urinary bladder is unremarkable.  Uterus is unremarkable.  Sigmoid colonic diverticulosis. There is marked circumferential wall thickening of the sigmoid colon with surrounding fat stranding and fluid. No evidence for free intraperitoneal air.  No evidence for bowel obstruction.  No aggressive or acute appearing osseous lesions.  IMPRESSION: Findings most compatible with marked sigmoid colonic diverticulitis. If not previously performed, recommend correlation with colonoscopy upon resolution of the acute symptomatology to exclude underlying mass.  Nonobstructing  stone interpolar region right kidney.   Electronically Signed   By: Lovey Newcomer M.D.   On: 03/25/2014 16:46     EKG Interpretation None      MDM   Final diagnoses:  Abdominal pain  Diverticulitis large intestine w/o perforation or abscess w/bleeding  Non-intractable vomiting with nausea, vomiting of unspecified type    2:45 PM Labs unremarkable for acute changes. Vitals stable and patient afebrile. Patient will have morphine and zofran. CT abdomen pelvis to rule out appendicitis.     Alvina Chou, PA-C 03/26/14 [place], MD 03/26/14 1555

## 2014-03-25 NOTE — ED Notes (Signed)
Pt now vomiting after having 2 sips of water at 1753. PA to be made aware.

## 2014-03-25 NOTE — ED Notes (Signed)
Pt sent over for CT scan

## 2014-03-25 NOTE — ED Notes (Signed)
She c/o several day history of pressure with urination. She woke today with "intense pelvic pain." denies n/v/vaginal discharge or bleeding. She passed gas without any relief. She took milk of magnesia at 1030 and had a watery bowel movement with no relief. Pain has persisted since onset

## 2014-03-25 NOTE — Progress Notes (Signed)
ANTIBIOTIC CONSULT NOTE - INITIAL  Pharmacy Consult for Ciprofloxacin Indication: r/o intra-abdominal infection  Allergies  Allergen Reactions  . Meloxicam Other (See Comments)    REACTION: dizziness  . Penicillins Other (See Comments)    Reaction Unknown: runs in family  . Sulfonamide Derivatives Nausea And Vomiting    Patient Measurements: Height: 5\' 8"  (172.7 cm) Weight: 204 lb 9.6 oz (92.806 kg) IBW/kg (Calculated) : 63.9 Adjusted Body Weight:   Vital Signs: Temp: 98.6 F (37 C) (12/20 2049) Temp Source: Oral (12/20 2049) BP: 110/63 mmHg (12/20 2049) Pulse Rate: 72 (12/20 2049) Intake/Output from previous day:   Intake/Output from this shift:    Labs:  Recent Labs  03/25/14 1353  WBC 9.3  HGB 14.0  PLT 237  CREATININE 0.62   Estimated Creatinine Clearance: 95.8 mL/min (by C-G formula based on Cr of 0.62). No results for input(s): VANCOTROUGH, VANCOPEAK, VANCORANDOM, GENTTROUGH, GENTPEAK, GENTRANDOM, TOBRATROUGH, TOBRAPEAK, TOBRARND, AMIKACINPEAK, AMIKACINTROU, AMIKACIN in the last 72 hours.   Microbiology: No results found for this or any previous visit (from the past 720 hour(s)).  Medical History: Past Medical History  Diagnosis Date  . Asthma dx age 27  . Pneumonia   . Vertigo   . Migraines     poss atypical ? MRI    Medications:  Prescriptions prior to admission  Medication Sig Dispense Refill Last Dose  . albuterol (PROVENTIL HFA;VENTOLIN HFA) 108 (90 BASE) MCG/ACT inhaler Inhale 1-2 puffs into the lungs every 6 (six) hours as needed for wheezing or shortness of breath.   03/24/2014 at Unknown time  . cetirizine (ZYRTEC) 10 MG tablet Take 10 mg by mouth daily.   03/24/2014 at Unknown time  . cholecalciferol (VITAMIN D) 1000 UNITS tablet Take 1,000 Units by mouth daily.   03/24/2014 at Unknown time  . Cyanocobalamin (B-12) 1000 MCG CAPS Take by mouth.   03/24/2014 at Unknown time  . fluticasone (FLONASE) 50 MCG/ACT nasal spray 2 sprays in each  nostril once daily as needed (Patient taking differently: Place 2 sprays into both nostrils daily as needed for allergies or rhinitis. ) 48 g 3 03/24/2014 at Unknown time  . mometasone-formoterol (DULERA) 100-5 MCG/ACT AERO 2 puffs then rinse mouth, twice daily (Patient taking differently: Inhale 2 puffs into the lungs 2 (two) times daily. ) 3 Inhaler 3 03/24/2014 at Unknown time  . PROAIR HFA 108 (90 BASE) MCG/ACT inhaler INHALE 2 PUFFS EVERY 4 HOURS AS NEEDED FOR WHEEZING OR SHORTNESS OF BREATH 25.5 each 5    Assessment: 53yof to start Ciprofloxacin for possible intra-abdominal infection. Patient is currently afebrile and WBC wnl. Patient received Ciprofloxacin 400mg  in ED ~1800. - Crcl 96 ml/min  Plan:  1. Ciprofloxacin 400mg  IV q12h 2. Follow-up renal function, cultures, clinical progress and adjust as indicated  Elizabeth Pratt  [phone] 03/25/2014,9:37 PM

## 2014-03-26 DIAGNOSIS — N200 Calculus of kidney: Secondary | ICD-10-CM

## 2014-03-26 DIAGNOSIS — E669 Obesity, unspecified: Secondary | ICD-10-CM

## 2014-03-26 DIAGNOSIS — Z720 Tobacco use: Secondary | ICD-10-CM | POA: Diagnosis present

## 2014-03-26 DIAGNOSIS — J209 Acute bronchitis, unspecified: Secondary | ICD-10-CM | POA: Diagnosis present

## 2014-03-26 DIAGNOSIS — K760 Fatty (change of) liver, not elsewhere classified: Secondary | ICD-10-CM | POA: Diagnosis present

## 2014-03-26 HISTORY — DX: Obesity, unspecified: E66.9

## 2014-03-26 LAB — COMPREHENSIVE METABOLIC PANEL
ALT: 13 U/L (ref 0–35)
ANION GAP: 12 (ref 5–15)
AST: 13 U/L (ref 0–37)
Albumin: 3.2 g/dL — ABNORMAL LOW (ref 3.5–5.2)
Alkaline Phosphatase: 65 U/L (ref 39–117)
BILIRUBIN TOTAL: 0.6 mg/dL (ref 0.3–1.2)
BUN: 8 mg/dL (ref 6–23)
CHLORIDE: 106 meq/L (ref 96–112)
CO2: 24 mEq/L (ref 19–32)
Calcium: 8.4 mg/dL (ref 8.4–10.5)
Creatinine, Ser: 0.67 mg/dL (ref 0.50–1.10)
GFR calc non Af Amer: >90 mL/min (ref >90)
GLUCOSE: 95 mg/dL (ref 70–99)
Potassium: 3.9 mEq/L (ref 3.7–5.3)
SODIUM: 142 meq/L (ref 137–147)
Total Protein: 6.2 g/dL (ref 6.0–8.3)

## 2014-03-26 LAB — CBC
HEMATOCRIT: 37.9 % (ref 36.0–46.0)
HEMOGLOBIN: 12.4 g/dL (ref 12.0–15.0)
MCH: 29.2 pg (ref 26.0–34.0)
MCHC: 32.7 g/dL (ref 30.0–36.0)
MCV: 89.2 fL (ref 78.0–100.0)
Platelets: 207 10*3/uL (ref 150–400)
RBC: 4.25 MIL/uL (ref 3.87–5.11)
RDW: 13.2 % (ref 11.5–15.5)
WBC: 8.8 10*3/uL (ref 4.0–10.5)

## 2014-03-26 LAB — PROTIME-INR
INR: 1.15 (ref 0.00–1.49)
PROTHROMBIN TIME: 14.9 s (ref 11.6–15.2)

## 2014-03-26 MED ORDER — OXYCODONE HCL 5 MG PO TABS: 5.0000 mg | ORAL_TABLET | ORAL | Status: DC | PRN

## 2014-03-26 MED ORDER — PROMETHAZINE HCL 12.5 MG PO TABS: 12.5000 mg | ORAL_TABLET | Freq: Four times a day (QID) | ORAL | Status: DC | PRN

## 2014-03-26 MED ORDER — METRONIDAZOLE 500 MG PO TABS: 500.0000 mg | ORAL_TABLET | Freq: Three times a day (TID) | ORAL | Status: AC

## 2014-03-26 MED ORDER — LEVOFLOXACIN 500 MG PO TABS: 500.0000 mg | ORAL_TABLET | Freq: Every day | ORAL | Status: AC

## 2014-03-26 NOTE — Discharge Summary (Signed)
Discharge Summary  Elizabeth Pratt IDP:[id number] DOB: March 11, 1961  PCP: Lottie Dawson, MD  Admit date: 03/25/2014 Discharge date: 03/26/2014  Time spent: 35 minutes  Recommendations for Outpatient Follow-up:  1. New medications: Levaquin 500 mg by mouth daily 1 week 2. New medications: Flagyl 500 mg by mouth every 8 hours 1 week 3. New medication: OxyIR 5 mg every 4 hours when necessary for pain, total #30 4. Phenergan 12.5 mg by mouth every 6 hours when necessary for nausea 5. Patient will follow-up with her PCP in the next few weeks   Discharge Diagnoses:  Active Hospital Problems   Diagnosis Date Noted  . Sigmoid diverticulitis 03/25/2014  . Obesity (BMI 30-39.9) 03/26/2014  . Fatty liver 03/26/2014  . Renal stone 03/26/2014  . Tobacco use 03/26/2014  . Acute bronchitis 03/26/2014  . Asthma, chronic 03/25/2014    Resolved Hospital Problems   Diagnosis Date Noted Date Resolved  No resolved problems to display.    Discharge Condition: Improved, being discharged home  Diet recommendation: Patient being discharged on a low residue diet, advised to avoid nuts, seeds and kernels  Filed Weights   03/25/14 1325 03/25/14 2049  Weight: 86.183 kg (190 lb) 92.806 kg (204 lb 9.6 oz)    History of present illness:  Patient is 53 year old female past oral history of obesity and tobacco use who started a one-day history of left lower quadrant abdominal pain with nausea and vomiting and came into the emergency room for further evaluation. CT scan noted sigmoid diverticulitis.   Hospital Course:  Principal Problem:   Sigmoid diverticulitis:Patient started on IV antibiotics and by the following morning was feeling somewhat improved. She started on clear liquids and tolerated this well. She remained afebrile with normal white blood cell count. Patient 1 g to go home and given that she is tolerating clear liquids without any pain, after we talked about diverticulitis and  secondary diets with low residue, patient to be discharged initially on Cipro and Flagyl with Cipro changed over to Levaquin to also account for bronchitis Active Problems:   Asthma, chronic: Stable continued on when necessary inhaler   Obesity (BMI 30-39.9): Patient needs criteria with BMI greater than 30.    Fatty liver: Incidentally noted on CT. Discussed with patient. Recommend follow-up as needed by her primary care physician.    Renal stone: Asystematic, incidentally noted on CT scan. Patient has no previous history of renal stones.    Tobacco use: Patient counseled to quit.    Acute bronchitis: On follow-up exam, patient noted to have continued cough, both nonproductive and productive with whitish sputum. She says it's been worse the last 2 days. On my exam, noted to have some mild prolonged bilateral October wheezing. Given low-grade temp, Cipro replaced with Levaquin for lung coverage   Procedures:  None  Consultations:  None  Discharge Exam: BP 137/75 mmHg  Pulse 80  Temp(Src) 99.3 F (37.4 C) (Oral)  Resp 18  Ht 5\' 8"  (1.727 m)  Wt 92.806 kg (204 lb 9.6 oz)  BMI 31.12 kg/m2  SpO2 93%  General: Alert and oriented 3, no acute distress Cardiovascular: Regular rate and rhythm, S1-S2 Respiratory: Mild end expiratory wheeze bilaterally Abdomen: Soft, mild tenderness in left lower quadrant, minimal, normoactive bowel sounds  Discharge Instructions You were cared for by a hospitalist during your hospital stay. If you have any questions about your discharge medications or the care you received while you were in the hospital after you are discharged, you can call  the unit and asked to speak with the hospitalist on call if the hospitalist that took care of you is not available. Once you are discharged, your primary care physician will handle any further medical issues. Please note that NO REFILLS for any discharge medications will be authorized once you are discharged, as it  is imperative that you return to your primary care physician (or establish a relationship with a primary care physician if you do not have one) for your aftercare needs so that they can reassess your need for medications and monitor your lab values.  Discharge Instructions    Diet low fiber    Complete by:  As directed      Increase activity slowly    Complete by:  As directed             Medication List    TAKE these medications        B-12 1000 MCG Caps  Take by mouth.     cetirizine 10 MG tablet  Commonly known as:  ZYRTEC  Take 10 mg by mouth daily.     cholecalciferol 1000 UNITS tablet  Commonly known as:  VITAMIN D  Take 1,000 Units by mouth daily.     fluticasone 50 MCG/ACT nasal spray  Commonly known as:  FLONASE  2 sprays in each nostril once daily as needed     levofloxacin 500 MG tablet  Commonly known as:  LEVAQUIN  Take 1 tablet (500 mg total) by mouth daily.     metroNIDAZOLE 500 MG tablet  Commonly known as:  FLAGYL  Take 1 tablet (500 mg total) by mouth 3 (three) times daily.     mometasone-formoterol 100-5 MCG/ACT Aero  Commonly known as:  DULERA  2 puffs then rinse mouth, twice daily     oxyCODONE 5 MG immediate release tablet  Commonly known as:  Oxy IR/ROXICODONE  Take 1 tablet (5 mg total) by mouth every 4 (four) hours as needed for moderate pain.     albuterol 108 (90 BASE) MCG/ACT inhaler  Commonly known as:  PROVENTIL HFA;VENTOLIN HFA  Inhale 1-2 puffs into the lungs every 6 (six) hours as needed for wheezing or shortness of breath.     PROAIR HFA 108 (90 BASE) MCG/ACT inhaler  Generic drug:  albuterol  INHALE 2 PUFFS EVERY 4 HOURS AS NEEDED FOR WHEEZING OR SHORTNESS OF BREATH     promethazine 12.5 MG tablet  Commonly known as:  PHENERGAN  Take 1 tablet (12.5 mg total) by mouth every 6 (six) hours as needed for nausea or vomiting.       Allergies  Allergen Reactions  . Meloxicam Other (See Comments)    REACTION: dizziness  .  Penicillins Other (See Comments)    Reaction Unknown: runs in family  . Sulfonamide Derivatives Nausea And Vomiting       Follow-up Information    Follow up with Lottie Dawson, MD In 1 month.   Specialties:  Internal Medicine, Pediatrics   Contact information:   [place] [address] [phone]        The results of significant diagnostics from this hospitalization (including imaging, microbiology, ancillary and laboratory) are listed below for reference.    Significant Diagnostic Studies: Ct Abdomen Pelvis W Contrast  03/25/2014   CLINICAL DATA:  Patient with left lower quadrant pain for 2 days.  EXAM: CT ABDOMEN AND PELVIS WITH CONTRAST  TECHNIQUE: Multidetector CT imaging of the abdomen and pelvis was performed  using the standard protocol following bolus administration of intravenous contrast.  CONTRAST:  156mL OMNIPAQUE IOHEXOL 300 MG/ML  SOLN  COMPARISON:  None  FINDINGS: Visualization of the lower thorax demonstrates no consolidative or nodular pulmonary opacities. Normal heart size.  The liver is normal in size and contour. There are 2 subcentimeter low-attenuation lesions within the liver, too small to accurately characterize. Fatty deposition adjacent to the falciform ligament. The gallbladder is unremarkable. No intrahepatic or extrahepatic biliary ductal dilatation. The spleen is unremarkable. Pancreas is unremarkable. Normal bilateral adrenal glands.  There is a 2 mm nonobstructing stone within the interpolar region of the right kidney. The kidneys enhance symmetrically with contrast. No hydronephrosis.  Normal caliber abdominal aorta. No retroperitoneal lymphadenopathy. Urinary bladder is unremarkable.  Uterus is unremarkable.  Sigmoid colonic diverticulosis. There is marked circumferential wall thickening of the sigmoid colon with surrounding fat stranding and fluid. No evidence for free intraperitoneal air.  No evidence for bowel obstruction.  No  aggressive or acute appearing osseous lesions.  IMPRESSION: Findings most compatible with marked sigmoid colonic diverticulitis. If not previously performed, recommend correlation with colonoscopy upon resolution of the acute symptomatology to exclude underlying mass.  Nonobstructing stone interpolar region right kidney.   Electronically Signed   By: Lovey Newcomer M.D.   On: 03/25/2014 16:46    Microbiology: No results found for this or any previous visit (from the past 240 hour(s)).   Labs: Basic Metabolic Panel:  Recent Labs Lab 03/25/14 1353 03/26/14 0502  NA 139 142  K 4.0 3.9  CL 102 106  CO2 24 24  GLUCOSE 94 95  BUN 10 8  CREATININE 0.62 0.67  CALCIUM 9.3 8.4   Liver Function Tests:  Recent Labs Lab 03/25/14 1353 03/26/14 0502  AST 16 13  ALT 17 13  ALKPHOS 68 65  BILITOT 0.5 0.6  PROT 7.0 6.2  ALBUMIN 3.7 3.2*   No results for input(s): LIPASE, AMYLASE in the last 168 hours. No results for input(s): AMMONIA in the last 168 hours. CBC:  Recent Labs Lab 03/25/14 1353 03/26/14 0502  WBC 9.3 8.8  HGB 14.0 12.4  HCT 42.2 37.9  MCV 88.3 89.2  PLT 237 207   Cardiac Enzymes: No results for input(s): CKTOTAL, CKMB, CKMBINDEX, TROPONINI in the last 168 hours. BNP: BNP (last 3 results) No results for input(s): PROBNP in the last 8760 hours. CBG: No results for input(s): GLUCAP in the last 168 hours.     Signed:  Annita Brod  [facility] 03/26/2014, 6:16 PM

## 2014-03-26 NOTE — Discharge Instructions (Signed)

## 2014-04-03 ENCOUNTER — Telehealth: Payer: Self-pay | Admitting: Internal Medicine

## 2014-04-03 ENCOUNTER — Other Ambulatory Visit: Payer: Self-pay | Admitting: Internal Medicine

## 2014-04-03 DIAGNOSIS — K5732 Diverticulitis of large intestine without perforation or abscess without bleeding: Secondary | ICD-10-CM

## 2014-04-03 NOTE — Telephone Encounter (Signed)
Contact patient that we will  Work on Get her in to gi asap .( Since hospital team didn't do the referral . ) Assess how she is doing clincally . Fever pain etc  (also if needed appt with me 30 minutes next week   )      Admit date: 03/25/2014 Discharge date: 03/26/2014  Time spent: 35 minutes  Recommendations for Outpatient Follow-up:  1. New medications: Levaquin 500 mg by mouth daily 1 week 2. New medications: Flagyl 500 mg by mouth every 8 hours 1 week 3. New medication: OxyIR 5 mg every 4 hours when necessary for pain, total #30 4. Phenergan 12.5 mg by mouth every 6 hours when necessary for nausea 5. Patient will follow-up with her PCP in the next few weeks 6.

## 2014-04-03 NOTE — Addendum Note (Signed)
Addended by: Miles Costain T on: 04/03/2014 05:15 PM   Modules accepted: Orders

## 2014-04-03 NOTE — Telephone Encounter (Signed)
Order placed for gi referral.  Spoke to the pt.  Feeling much better.  Has some "twinges" after she eats and feces is not a normal consistency.  She describes it as "mushy."  Informed her this was normal.  Complete her antibiotics and eat a low fiber diet.  She finished the Levaquin yesterday and Flagyl today.  Advised if sx became worse or fever than she should notify the office immediately.  Will forward to [facility] for further advise or recommendations.

## 2014-04-03 NOTE — Telephone Encounter (Signed)
Pt had a bout of diverticulitis  Pt went to ED and finished antibiotics prescribed from  ED.  Pt advised to have  colonoscopy after this has calmed down,  Pt would like to know what she should do next.  Pt wants to know if she needs referral to a GI doctor, bc she does not have one. Pt would like to know what to do/where to go next. pls advise

## 2014-04-04 ENCOUNTER — Other Ambulatory Visit: Payer: Self-pay | Admitting: Internal Medicine

## 2014-04-04 ENCOUNTER — Telehealth: Payer: Self-pay | Admitting: Internal Medicine

## 2014-04-04 MED ORDER — MOMETASONE FURO-FORMOTEROL FUM 100-5 MCG/ACT IN AERO: INHALATION_SPRAY | RESPIRATORY_TRACT | Status: DC

## 2014-04-04 NOTE — Telephone Encounter (Signed)
Called and spoke with pt and she is requesting that the dulera be sent to her local pharmacy.  She stated that if this is done today she will not have a $500 co pay.  dulera has been sent to her pharmacy and nothing further is needed.

## 2014-04-04 NOTE — Telephone Encounter (Signed)
Ok   Same advice agree.

## 2014-04-09 ENCOUNTER — Encounter: Payer: Self-pay | Admitting: Physician Assistant

## 2014-04-09 ENCOUNTER — Telehealth: Payer: Self-pay | Admitting: Internal Medicine

## 2014-04-09 NOTE — Telephone Encounter (Signed)
Spoke to the pt.  She is scheduled for GI on 04/12/14 @ 9:15

## 2014-04-09 NOTE — Telephone Encounter (Signed)
Pt states she was instructed to have her colonoscopy asap. Pt still on restricted diet and had not heard from anyone. Advised pt referral was sent to GI. Pt would like to ensure this is a stat referral and she can get done quickly. Thanks!

## 2014-04-12 ENCOUNTER — Encounter: Payer: Self-pay | Admitting: Physician Assistant

## 2014-04-12 ENCOUNTER — Ambulatory Visit (INDEPENDENT_AMBULATORY_CARE_PROVIDER_SITE_OTHER): Payer: 59 | Admitting: Physician Assistant

## 2014-04-12 VITALS — BP 130/80 | HR 64

## 2014-04-12 DIAGNOSIS — R194 Change in bowel habit: Secondary | ICD-10-CM

## 2014-04-12 DIAGNOSIS — K5730 Diverticulosis of large intestine without perforation or abscess without bleeding: Secondary | ICD-10-CM

## 2014-04-12 NOTE — Progress Notes (Signed)
Patient ID: Elizabeth Pratt, female   DOB: 1960-08-18, 54 y.o.   MRN: [id number]    HPI:    Elizabeth Pratt is a 54 year old female referred for evaluation by Dr. Lieutenant Diego not sure due to a recent episode of diverticulitis.  Elizabeth Pratt says that for the past 10 months the character of her bowel movement have changed. Whereas she will normally had a formed bowel movement on a daily basis her stools have been alternating between loose and formed and have been much thinner than normal. She has had no bright red blood per rectum or melena. She's had no anorexia or unexplained weight loss. She did have an episode of abdominal pain in December and was seen in the emergency room where she had a CT scan that showed marked sigmoid colonic diverticulitis. She was treated with Levaquin and Flagyl with resolution of her symptoms however due to 2 the circumferential wall thickening of the sigmoid she's been advised to have a colonoscopy. She has a maternal who had colon cancer and a cousin who had colon cancer at a fairly young age however she does not have a first-degree relative who has had colon cancer. Her mother died at 22 of breast cancer and her father died at 2 of CHF. She has a sister with diverticular disease and a brother with diverticular disease. She also has another sister who is 76 years old and had ovarian cancer and another sister in her 50s who has breast cancer.   Past Medical History  Diagnosis Date  . Asthma dx age 64  . Pneumonia   . Vertigo   . Migraines     poss atypical ? MRI  . Diverticulitis   . Diverticulosis     Past Surgical History  Procedure Laterality Date  . Dental extractions    . Dental extractions     Family History  Problem Relation Age of Onset  . Asthma Mother   . Breast cancer Mother   . Asthma Sister     smoker  . Asthma Sister     smoker  . Breast cancer Sister   . Ovarian cancer Sister   . ADD / ADHD Son    History  Substance Use Topics  . Smoking status:  Current Some Day Smoker -- 0.10 packs/day    Types: Cigarettes  . Smokeless tobacco: Not on file     Comment: has quit "lots of times"  . Alcohol Use: Yes   Current Outpatient Prescriptions  Medication Sig Dispense Refill  . albuterol (PROVENTIL HFA;VENTOLIN HFA) 108 (90 BASE) MCG/ACT inhaler Inhale 1-2 puffs into the lungs every 6 (six) hours as needed for wheezing or shortness of breath.    . cetirizine (ZYRTEC) 10 MG tablet Take 10 mg by mouth daily.    . cholecalciferol (VITAMIN D) 1000 UNITS tablet Take 1,000 Units by mouth daily.    . Cyanocobalamin (B-12) 1000 MCG CAPS Take by mouth.    . fluticasone (FLONASE) 50 MCG/ACT nasal spray 2 sprays in each nostril once daily as needed (Patient taking differently: Place 2 sprays into both nostrils daily as needed for allergies or rhinitis. ) 48 g 3  . mometasone-formoterol (DULERA) 100-5 MCG/ACT AERO 2 puffs then rinse mouth, twice daily 3 Inhaler 3  . PROAIR HFA 108 (90 BASE) MCG/ACT inhaler INHALE 2 PUFFS EVERY 4 HOURS AS NEEDED FOR WHEEZING OR SHORTNESS OF BREATH (Patient not taking: Reported on 04/12/2014) 25.5 each 5  . promethazine (PHENERGAN) 12.5 MG tablet Take 1 tablet (  12.5 mg total) by mouth every 6 (six) hours as needed for nausea or vomiting. (Patient not taking: Reported on 04/12/2014) 30 tablet 0  . promethazine (PHENERGAN) 25 MG tablet Take 1 tablet (25 mg total) by mouth every 6 (six) hours as needed for nausea (every 4-6 hours ). 15 tablet 0   No current facility-administered medications for this visit.   Allergies  Allergen Reactions  . Dilaudid [Hydromorphone Hcl] Nausea And Vomiting  . Meloxicam Other (See Comments)    REACTION: dizziness  . Penicillins Other (See Comments)    Reaction Unknown: runs in family  . Sulfonamide Derivatives Nausea And Vomiting     Review of Systems: Gen: Denies any fever, chills, sweats, anorexia, fatigue, weakness, malaise, weight loss, and sleep disorder CV: Denies chest pain, angina,  palpitations, syncope, orthopnea, PND, peripheral edema, and claudication. Resp: Denies dyspnea at rest, dyspnea with exercise, cough, sputum, wheezing, coughing up blood, and pleurisy. GI: Denies vomiting blood, jaundice, and fecal incontinence.   Denies dysphagia or odynophagia. GU : Denies urinary burning, blood in urine, urinary frequency, urinary hesitancy, nocturnal urination, and urinary incontinence. MS: Denies joint pain, limitation of movement, and swelling, stiffness, low back pain, extremity pain. Denies muscle weakness, cramps, atrophy.  Derm: Denies rash, itching, dry skin, hives, moles, warts, or unhealing ulcers.  Psych: Denies depression, anxiety, memory loss, suicidal ideation, hallucinations, paranoia, and confusion. Heme: Denies bruising, bleeding, and enlarged lymph nodes. Neuro:  Denies any headaches, dizziness, paresthesias. Endo:  Denies any problems with DM, thyroid, adrenal function  Studies: Ct Abdomen Pelvis W Contrast  03/25/2014   CLINICAL DATA:  Patient with left lower quadrant pain for 2 days.  EXAM: CT ABDOMEN AND PELVIS WITH CONTRAST  TECHNIQUE: Multidetector CT imaging of the abdomen and pelvis was performed using the standard protocol following bolus administration of intravenous contrast.  CONTRAST:  141mL OMNIPAQUE IOHEXOL 300 MG/ML  SOLN  COMPARISON:  None  FINDINGS: Visualization of the lower thorax demonstrates no consolidative or nodular pulmonary opacities. Normal heart size.  The liver is normal in size and contour. There are 2 subcentimeter low-attenuation lesions within the liver, too small to accurately characterize. Fatty deposition adjacent to the falciform ligament. The gallbladder is unremarkable. No intrahepatic or extrahepatic biliary ductal dilatation. The spleen is unremarkable. Pancreas is unremarkable. Normal bilateral adrenal glands.  There is a 2 mm nonobstructing stone within the interpolar region of the right kidney. The kidneys enhance  symmetrically with contrast. No hydronephrosis.  Normal caliber abdominal aorta. No retroperitoneal lymphadenopathy. Urinary bladder is unremarkable.  Uterus is unremarkable.  Sigmoid colonic diverticulosis. There is marked circumferential wall thickening of the sigmoid colon with surrounding fat stranding and fluid. No evidence for free intraperitoneal air.  No evidence for bowel obstruction.  No aggressive or acute appearing osseous lesions.  IMPRESSION: Findings most compatible with marked sigmoid colonic diverticulitis. If not previously performed, recommend correlation with colonoscopy upon resolution of the acute symptomatology to exclude underlying mass.  Nonobstructing stone interpolar region right kidney.   Electronically Signed   By: Lovey Newcomer M.D.   On: 03/25/2014 16:46    LAB RESULTS: CBC on 03/25/2014 had a white blood cell count 9.3, hemoglobin 14, hematocrit 42.2, platelets 237,000.  Physical Exam: BP 130/80 mmHg  Pulse 64 Constitutional: Pleasant,well-develop female in no acute distress. HEENT: Normocephalic and atraumatic. Conjunctivae are normal. No scleral icterus. Neck supple. No thyromegaly or adenopathy Cardiovascular: Normal rate, regular rhythm.  Pulmonary/chest: Effort normal and breath sounds normal. No wheezing, rales  or rhonchi. Abdominal: Soft, nondistended, nontender. Bowel sounds active throughout. There are no masses palpable. No hepatomegaly. Extremities: no edema Lymphadenopathy: No cervical adenopathy noted. Neurological: Alert and oriented to person place and time. Skin: Skin is warm and dry. No rashes noted. Psychiatric: Normal mood and affect. Behavior is normal.  ASSESSMENT AND PLAN: 54 year old female with a 10 month history of a change in bowel habits status post a recent episode of diverticulitis referred for evaluation. She's been advised to adhere to a high-fiber low-fat diet. She will be scheduled for a colonoscopy to evaluate for polyps,  neoplasia, or inflammatory bowel disease.The risks, benefits, and alternatives to colonoscopy with possible biopsy and possible polypectomy were discussed with the patient and they consent to proceed. The procedure will be scheduled with Dr. Carlean Purl. Further recommendations will be made pending the findings of her colonoscopy.    Raliegh Scobie, Vita Barley PA-C 04/12/2014, 11:50 AM

## 2014-04-12 NOTE — Patient Instructions (Addendum)
You have been scheduled for a colonoscopy. Please follow written instructions given to you at your visit today.  Please pick up your prep kit at the pharmacy. If you use inhalers (even only as needed), please bring them with you on the day of your procedure. Your physician has requested that you go to www.startemmi.com and enter the access code given to you at your visit today. This web site gives a general overview about your procedure. However, you should still follow specific instructions given to you by our office regarding your preparation for the procedure. CC:  Shanon Ace MD

## 2014-04-12 NOTE — Progress Notes (Signed)
Agree w/ Ms. Hvozdovic's note and mangement.  

## 2014-04-30 ENCOUNTER — Encounter: Payer: Self-pay | Admitting: Gastroenterology

## 2014-04-30 ENCOUNTER — Ambulatory Visit (AMBULATORY_SURGERY_CENTER): Payer: 59 | Admitting: Gastroenterology

## 2014-04-30 VITALS — BP 129/80 | HR 50 | Temp 98.1°F | Resp 20 | Ht 68.0 in | Wt 185.0 lb

## 2014-04-30 DIAGNOSIS — K5730 Diverticulosis of large intestine without perforation or abscess without bleeding: Secondary | ICD-10-CM

## 2014-04-30 DIAGNOSIS — D128 Benign neoplasm of rectum: Secondary | ICD-10-CM

## 2014-04-30 DIAGNOSIS — D129 Benign neoplasm of anus and anal canal: Secondary | ICD-10-CM

## 2014-04-30 DIAGNOSIS — D125 Benign neoplasm of sigmoid colon: Secondary | ICD-10-CM

## 2014-04-30 DIAGNOSIS — K5732 Diverticulitis of large intestine without perforation or abscess without bleeding: Secondary | ICD-10-CM

## 2014-04-30 MED ORDER — SODIUM CHLORIDE 0.9 % IV SOLN: 500.0000 mL | INTRAVENOUS | Status: DC

## 2014-04-30 NOTE — Progress Notes (Signed)
Called to room to assist during endoscopic procedure.  Patient ID and intended procedure confirmed with present staff. Received instructions for my participation in the procedure from the performing physician.  

## 2014-04-30 NOTE — Op Note (Signed)
[facility]  [address] [place], [address]   COLONOSCOPY PROCEDURE REPORT  PATIENT: Elizabeth, Pratt  MR#: [id number] BIRTHDATE: [birth year] , 53  yrs. old GENDER: female ENDOSCOPIST: Inda Castle, MD REFERRED [id number] Darnelle Going, M.D. PROCEDURE DATE:  04/30/2014 PROCEDURE:   Colonoscopy with snare polypectomy First Screening Colonoscopy - Avg.  risk and is 50 yrs.  old or older Yes.  Prior Negative Screening - Now for repeat screening. N/A  History of Adenoma - Now for follow-up colonoscopy & has been > or = to 3 yrs.  N/A  Polyps Removed Today? Yes. ASA CLASS:   Class II INDICATIONS:first colonoscopy and History of acute diverticulitis.  MEDICATIONS: Monitored anesthesia care and Propofol 290 mg IV  DESCRIPTION OF PROCEDURE:   After the risks benefits and alternatives of the procedure were thoroughly explained, informed consent was obtained.  The digital rectal exam revealed no abnormalities of the rectum.   The [id number]  endoscope was introduced through the anus and advanced to the cecum, which was identified by both the appendix and ileocecal valve. No adverse events experienced.   The quality of the prep was Suprep good  The instrument was then slowly withdrawn as the colon was fully examined.      COLON FINDINGS: There was moderate diverticulosis noted in the sigmoid colon with associated muscular hypertrophy.   A sessile polyp measuring 3 mm in size was found in the rectum.  A polypectomy was performed with a cold snare.  The resection was complete, the polyp tissue was completely retrieved and sent to histology.  Retroflexed views revealed no abnormalities. The time to cecum=5 minutes 190seconds.  Withdrawal time=6 minutes 42 seconds.  The scope was withdrawn and the procedure completed. COMPLICATIONS: There were no immediate complications.  ENDOSCOPIC IMPRESSION: 1.   There was moderate diverticulosis noted in the sigmoid colon 2.    Sessile polyp was found in the rectum; polypectomy was performed with a cold snare  RECOMMENDATIONS: If the polyp(s) removed today are proven to be adenomatous (pre-cancerous) polyps, you will need a repeat colonoscopy in 5 years.  Otherwise you should continue to follow colorectal cancer screening guidelines for "routine risk" patients with colonoscopy in 10 years.  You will receive a letter within 1-2 weeks with the results of your biopsy as well as final recommendations.  Please call my office if you have not received a letter after 3 weeks. Fiber supplementation  eSigned:  Inda Castle, MD 04/30/2014 3:15 PM   cc:   PATIENT NAME:  Elizabeth, Pratt MR#: [id number]

## 2014-04-30 NOTE — Patient Instructions (Signed)
Diverticulosis seen today and colon polyp removed. Handouts given on polyps, high fiber diet and diverticulosis. Use daily fiber supplementation.   YOU HAD AN ENDOSCOPIC PROCEDURE TODAY AT [facility]: Refer to the procedure report that was given to you for any specific questions about what was found during the examination.  If the procedure report does not answer your questions, please call your gastroenterologist to clarify.  If you requested that your care partner not be given the details of your procedure findings, then the procedure report has been included in a sealed envelope for you to review at your convenience later.  YOU SHOULD EXPECT: Some feelings of bloating in the abdomen. Passage of more gas than usual.  Walking can help get rid of the air that was put into your GI tract during the procedure and reduce the bloating. If you had a lower endoscopy (such as a colonoscopy or flexible sigmoidoscopy) you may notice spotting of blood in your stool or on the toilet paper. If you underwent a bowel prep for your procedure, then you may not have a normal bowel movement for a few days.  DIET: Your first meal following the procedure should be a light meal and then it is ok to progress to your normal diet.  A half-sandwich or bowl of soup is an example of a good first meal.  Heavy or fried foods are harder to digest and may make you feel nauseous or bloated.  Likewise meals heavy in dairy and vegetables can cause extra gas to form and this can also increase the bloating.  Drink plenty of fluids but you should avoid alcoholic beverages for 24 hours.  ACTIVITY: Your care partner should take you home directly after the procedure.  You should plan to take it easy, moving slowly for the rest of the day.  You can resume normal activity the day after the procedure however you should NOT DRIVE or use heavy machinery for 24 hours (because of the sedation medicines used during the test).     SYMPTOMS TO REPORT IMMEDIATELY: A gastroenterologist can be reached at any hour.  During normal business hours, 8:30 AM to 5:00 PM Monday through Friday, call [phone].  After hours and on weekends, please call the GI answering service at [phone] who will take a message and have the physician on call contact you.   Following lower endoscopy (colonoscopy or flexible sigmoidoscopy):  Excessive amounts of blood in the stool  Significant tenderness or worsening of abdominal pains  Swelling of the abdomen that is new, acute  Fever of 100F or higher  Following upper endoscopy (EGD)  Vomiting of blood or coffee ground material  New chest pain or pain under the shoulder blades  Painful or persistently difficult swallowing  New shortness of breath  Fever of 100F or higher  Black, tarry-looking stools  FOLLOW UP: If any biopsies were taken you will be contacted by phone or by letter within the next 1-3 weeks.  Call your gastroenterologist if you have not heard about the biopsies in 3 weeks.  Our staff will call the home number listed on your records the next business day following your procedure to check on you and address any questions or concerns that you may have at that time regarding the information given to you following your procedure. This is a courtesy call and so if there is no answer at the home number and we have not heard from you through the emergency physician  on call, we will assume that you have returned to your regular daily activities without incident.  SIGNATURES/CONFIDENTIALITY: You and/or your care partner have signed paperwork which will be entered into your electronic medical record.  These signatures attest to the fact that that the information above on your After Visit Summary has been reviewed and is understood.  Full responsibility of the confidentiality of this discharge information lies with you and/or your care-partner.

## 2014-04-30 NOTE — Progress Notes (Signed)
Procedure ends, to recovery, reprt given and VSS.

## 2014-05-01 ENCOUNTER — Telehealth: Payer: Self-pay | Admitting: *Deleted

## 2014-05-01 NOTE — Telephone Encounter (Signed)
  Follow up Call-  Call back number 04/30/2014  Post procedure Call Back phone  # [phone]  Permission to leave phone message Yes     Patient questions:  Do you have a fever, pain , or abdominal swelling? No. Pain Score  0 *  Have you tolerated food without any problems? Yes.    Have you been able to return to your normal activities? Yes.    Do you have any questions about your discharge instructions: Diet   No. Medications  No. Follow up visit  No.  Do you have questions or concerns about your Care? No.  Actions: * If pain score is 4 or above: No action needed, pain <4.

## 2014-05-07 ENCOUNTER — Encounter: Payer: 59 | Admitting: Internal Medicine

## 2014-05-09 ENCOUNTER — Encounter: Payer: Self-pay | Admitting: Gastroenterology

## 2014-11-08 ENCOUNTER — Encounter: Payer: Self-pay | Admitting: Internal Medicine

## 2014-11-08 ENCOUNTER — Ambulatory Visit (INDEPENDENT_AMBULATORY_CARE_PROVIDER_SITE_OTHER): Payer: Commercial Managed Care - HMO | Admitting: Internal Medicine

## 2014-11-08 VITALS — BP 122/82 | HR 72 | Ht 66.0 in | Wt 195.0 lb

## 2014-11-08 DIAGNOSIS — F17200 Nicotine dependence, unspecified, uncomplicated: Secondary | ICD-10-CM

## 2014-11-08 DIAGNOSIS — J209 Acute bronchitis, unspecified: Secondary | ICD-10-CM

## 2014-11-08 DIAGNOSIS — Z720 Tobacco use: Secondary | ICD-10-CM | POA: Diagnosis not present

## 2014-11-08 DIAGNOSIS — J4520 Mild intermittent asthma, uncomplicated: Secondary | ICD-10-CM | POA: Diagnosis not present

## 2014-11-08 MED ORDER — AZITHROMYCIN 250 MG PO TABS: ORAL_TABLET | ORAL | Status: DC

## 2014-11-08 NOTE — Progress Notes (Signed)
Patient ID: Elizabeth Pratt, female    DOB: 23-Nov-1960, 54 y.o.   MRN: [id number]  HPI 54 yo female with known hx of asthma  09/08/10 Acute OV  Pr presents for an acute office visit. Complains of prod cough with "clear, slimy" mucus, using resue q2h, fever up to 101, wheezing, increased SOB x3days.  OTC meds not helping. Started with nasal drip and drainage. Wheezing is getting worse.  Cough is keeping her up at night.   05/11/11- 69 yoF former smoker (Dec, 2012) followed for asthma, bronchitis Seen here June 4 by the nurse practitioner. I last saw her 05/09/10. She quit smoking on her own 6 or 7 weeks ago and was not tempted when her smoking sister visited. She has a productive cough with clear phlegm. Using her rescue inhaler about twice a day. Is not as effective/lasting as she wishes. Continues Qvar 40. Change to Zyrtec to Claritin. Dyspnea on exertion with activity much more vigorous than usual ADLs. Denies chest pain, nodes, blood.  PFT 04/30/08- FEV1 2.79/99%, FEV1/FVC 0.75, significant response to bronchodilator, TLC 94%, DLCO 109%.  09/07/11- 79 yoF former smoker (Dec, 2012) followed for asthma, bronchitis Completed prednisone taper-feels better; currently using Mucinex as well to help clear mucus. Had not taken Symbicort due to changes in pharmacies but will transfer asap. Smoking again, blaming "stress". She had called reporting increased bronchitis with cough 2 weeks ago when we had called in prednisone which helped. She just started Mucinex 2 days ago. CXR 05/13/11-  IMPRESSION:  No active cardiopulmonary disease.  Original Report Authenticated By: Jamas Lav, M.D.   03/07/12- 58 yoF smoker followed for asthma, bronchitis FOLLOWS FOR: breathing has improved since last OV. Denies chest pain, tightness or increased SOB. [name] has had flu vaccine. Smoking on and off and says she "quit" again 2 weeks ago. I strongly encouraged her to stay off permanently, emphasizing inpact on her breathing.  We discussed recommended use of Symbicort. Did not get CXR last year because of insurance issue 09/05/12- 33 yoF smoker followed for asthma, bronchitis FOLLOWS FOR: Doing well even through the allergy season Smokes intermittently- less than in past. We discussed this again. Good spring season. Less exposure to school-age children and their colds.  02/23/13- 22 yoF smoker followed for asthma, bronchitis Pt c/o prod cough with clear mucus. She may be smoking less currently but she has not made a real effort to quit now. Recent upper respiratory infection with residual thick mucus, nonpurulent. No fever or sore throat. She feels this is taking long to clear. CXR 05/11/11 IMPRESSION:  No active cardiopulmonary disease.  Original Report Authenticated By: Jamas Lav, M.D.  02/05/14- 58 yoF smoker followed for asthma, bronchitis FOLLOWS HFW:YOVZCHY flu shot as she is due for hand procedure soon; would like to get a sample of Proair if we have one-moving things in house and lost hers as well as insurance will not pay for oit yet. Pt states her breathing is doing well-slightly hoarse(out of Zyrtec) otherwise doing well.  Had cold resolved w/o assistance.  Pending trigger finger surgery- discussed timing of flu shot  11/08/14- 53 yoF former smoker followed for asthma, bronchitis Reports: prod cough w/thick mucus; feels like sinus congestion; feels like it is in chest. Quit smoking in December. Preschool teacher with significant viral exposures. Not sick, no shortness of breath. Admits some nasal congestion and mild chest congestion with scant clear sputum  ROS-see HPI Constitutional:   No-   weight loss,  night sweats, fevers, chills, fatigue, lassitude. HEENT:   No-  headaches, difficulty swallowing, tooth/dental problems, sore throat,       No-  sneezing, itching, ear ache, + some nasal congestion, +post nasal drip,  CV:  No-   chest pain, orthopnea, PND, swelling in lower extremities, anasarca,  dizziness, palpitations Resp: +shortness of breath with  + exertion not at rest.          +No-productive cough,  No non-productive cough,  No- coughing up of blood.              No-   change in color of mucus.  No-wheezing.   Skin: No-   rash or lesions. GI:  No-   heartburn, indigestion, abdominal pain, nausea, vomiting,  GU: , MS:  No-   joint pain or swelling.  . Neuro-     nothing unusual Psych:  No- change in mood or affect. No depression or anxiety.  No memory loss.  Objective:  OBJ- Physical Exam- unremarkable General- Alert, Oriented, Affect-appropriate, Distress- none acute, overweight Skin- rash-none, lesions- none, excoriation- none Lymphadenopathy- none Head- atraumatic            Eyes- Gross vision intact, PERRLA, conjunctivae and secretions clear            Ears- Hearing, canals-normal            Nose-  no-Septal dev, mucus+, no-polyps, erosion, perforation             Throat- Mallampati II , mucosa clear , drainage- none, tonsils- atrophic Neck- flexible , trachea midline, no stridor , thyroid nl, carotid no bruit Chest - symmetrical excursion , unlabored           Heart/CV- RRR , no murmur , no gallop  , no rub, nl s1 s2                           - JVD- none , edema- none, stasis changes- none, varices- none           Lung- clear, cough-none , dullness-none, rub- none           Chest wall-  Abd- Br/ Gen/ Rectal- Not done, not indicated Extrem- cyanosis- none, clubbing, none, atrophy- none, strength- nl Neuro- grossly intact to observation

## 2014-11-08 NOTE — Patient Instructions (Addendum)
Order- CXR dx acute bronchitis  Script for Zpak to hold  Please call as needed

## 2014-11-11 ENCOUNTER — Encounter: Payer: Self-pay | Admitting: Internal Medicine

## 2014-11-11 NOTE — Assessment & Plan Note (Signed)
Mild intermittent uncomplicated well-controlled at this time off cigarettes. Plan-Z-Pak to hold with discussion. Chest x-ray.

## 2014-11-11 NOTE — Assessment & Plan Note (Signed)
She feels she can stay off of tobacco this time. We discussed support. Strongly encouraging.

## 2015-01-28 ENCOUNTER — Other Ambulatory Visit: Payer: Self-pay | Admitting: Internal Medicine

## 2015-04-22 ENCOUNTER — Encounter: Payer: Self-pay | Admitting: Internal Medicine

## 2015-04-22 ENCOUNTER — Ambulatory Visit (INDEPENDENT_AMBULATORY_CARE_PROVIDER_SITE_OTHER): Payer: Commercial Managed Care - HMO | Admitting: Internal Medicine

## 2015-04-22 VITALS — BP 146/100 | Temp 98.2°F | Wt 196.1 lb

## 2015-04-22 DIAGNOSIS — R030 Elevated blood-pressure reading, without diagnosis of hypertension: Secondary | ICD-10-CM

## 2015-04-22 DIAGNOSIS — H109 Unspecified conjunctivitis: Secondary | ICD-10-CM

## 2015-04-22 DIAGNOSIS — IMO0001 Reserved for inherently not codable concepts without codable children: Secondary | ICD-10-CM

## 2015-04-22 MED ORDER — POLYMYXIN B-TRIMETHOPRIM 10000-0.1 UNIT/ML-% OP SOLN: 1.0000 [drp] | OPHTHALMIC | Status: DC

## 2015-04-22 NOTE — Patient Instructions (Addendum)
Eye drops   For up to a week  Avoid contacts until better .  Bacterial Conjunctivitis Bacterial conjunctivitis, commonly called pink eye, is an inflammation of the clear membrane that covers the white part of the eye (conjunctiva). The inflammation can also happen on the underside of the eyelids. The blood vessels in the conjunctiva become inflamed, causing the eye to become red or pink. Bacterial conjunctivitis may spread easily from one eye to another and from person to person (contagious).  CAUSES  Bacterial conjunctivitis is caused by bacteria. The bacteria may come from your own skin, your upper respiratory tract, or from someone else with bacterial conjunctivitis. SYMPTOMS  The normally white color of the eye or the underside of the eyelid is usually pink or red. The pink eye is usually associated with irritation, tearing, and some sensitivity to light. Bacterial conjunctivitis is often associated with a thick, yellowish discharge from the eye. The discharge may turn into a crust on the eyelids overnight, which causes your eyelids to stick together. If a discharge is present, there may also be some blurred vision in the affected eye. DIAGNOSIS  Bacterial conjunctivitis is diagnosed by your caregiver through an eye exam and the symptoms that you report. Your caregiver looks for changes in the surface tissues of your eyes, which may point to the specific type of conjunctivitis. A sample of any discharge may be collected on a cotton-tip swab if you have a severe case of conjunctivitis, if your cornea is affected, or if you keep getting repeat infections that do not respond to treatment. The sample will be sent to a lab to see if the inflammation is caused by a bacterial infection and to see if the infection will respond to antibiotic medicines. TREATMENT   Bacterial conjunctivitis is treated with antibiotics. Antibiotic eyedrops are most often used. However, antibiotic ointments are also available.  Antibiotics pills are sometimes used. Artificial tears or eye washes may ease discomfort. HOME CARE INSTRUCTIONS   To ease discomfort, apply a cool, clean washcloth to your eye for 10-20 minutes, 3-4 times a day.  Gently wipe away any drainage from your eye with a warm, wet washcloth or a cotton ball.  Wash your hands often with soap and water. Use paper towels to dry your hands.  Do not share towels or washcloths. This may spread the infection.  Change or wash your pillowcase every day.  You should not use eye makeup until the infection is gone.  Do not operate machinery or drive if your vision is blurred.  Stop using contact lenses. Ask your caregiver how to sterilize or replace your contacts before using them again. This depends on the type of contact lenses that you use.  When applying medicine to the infected eye, do not touch the edge of your eyelid with the eyedrop bottle or ointment tube. SEEK IMMEDIATE MEDICAL CARE IF:   Your infection has not improved within 3 days after beginning treatment.  You had yellow discharge from your eye and it returns.  You have increased eye pain.  Your eye redness is spreading.  Your vision becomes blurred.  You have a fever or persistent symptoms for more than 2-3 days.  You have a fever and your symptoms suddenly get worse.  You have facial pain, redness, or swelling. MAKE SURE YOU:   Understand these instructions.  Will watch your condition.  Will get help right away if you are not doing well or get worse.   This information is  not intended to replace advice given to you by your health care provider. Make sure you discuss any questions you have with your health care provider.   Document Released: 03/23/2005 Document Revised: 04/13/2014 Document Reviewed: 08/24/2011 Elsevier Interactive Patient Education 2016 [place] blood pressure was up.  Today .   Check at home to be surea below 140/90

## 2015-04-22 NOTE — Progress Notes (Signed)
Pre visit review using our clinic review tool, if applicable. No additional management support is needed unless otherwise documented below in the visit note.  Chief Complaint  Patient presents with  . Rt Eye Redness    Started over the weekend.  Matted this morning.    HPI: Patient Elizabeth Pratt  comes in today for SDA for  new problem evaluation. Last visit was almost 3 years ago  Has seen dr young for her asthma  And has been tobacco free for over a year!.  Had onset of right eye crusted  And  For the past few days and crusting in am without new eye pain of vision change.  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 ROS: See pertinent positives and negatives per HPI.  Past Medical History  Diagnosis Date  . Asthma dx age 25  . Pneumonia   . Vertigo   . Migraines     poss atypical ? MRI  . Diverticulitis   . Diverticulosis     Family History  Problem Relation Age of Onset  . Asthma Mother   . Breast cancer Mother   . Asthma Sister     smoker  . Asthma Sister     smoker  . Breast cancer Sister   . Ovarian cancer Sister   . ADD / ADHD Son     Social History   Social History  . Marital Status: Single    Spouse Name: N/A  . Number of Children: N/A  . Years of Education: N/A   Occupational History  . 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 Smoking status: Former Smoker -- 0.10 packs/day    Types: Cigarettes    Quit date: 03/30/2014  . Smokeless tobacco: None     Comment: has quit "lots of times"  . Alcohol Use: Yes  . Drug Use: No  . Sexual Activity: Not Asked   Other Topics Concern  . 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 cetirizine (ZYRTEC)  10 MG tablet Take 10 mg by mouth daily.    . cholecalciferol (VITAMIN D) 1000 UNITS tablet Take 1,000 Units by mouth daily.    . Cyanocobalamin (B-12) 1000 MCG CAPS Take by mouth.    . fluticasone (FLONASE) 50 MCG/ACT nasal spray USE 2 SPRAYS IN EACH NOSTRIL ONCE DAILY AS NEEDED 16 g 0  . mometasone-formoterol (DULERA) 100-5 MCG/ACT AERO 2 puffs then rinse mouth, twice daily 3 Inhaler 3  . PROAIR HFA 108 (90 BASE) MCG/ACT inhaler INHALE 2 PUFFS EVERY 4 HOURS AS NEEDED FOR WHEEZING OR SHORTNESS OF BREATH 25.5 each 5  . albuterol (PROVENTIL HFA;VENTOLIN HFA) 108 (90 BASE) MCG/ACT inhaler Inhale 1-2 puffs into the lungs every 6 (six) hours as needed for wheezing or shortness of breath.    [name] azithromycin (ZITHROMAX) 250 MG tablet 2 today then one daily 6 tablet 0  . promethazine (PHENERGAN) 25 MG tablet Take 1 tablet (25 mg total) by mouth every 6 (six) hours as needed for nausea (every 4-6 hours ). 15 tablet 0  No facility-administered medications prior to visit.     EXAM:  BP 146/100 mmHg  Temp(Src) 98.2 F (36.8 C) (Oral)  Wt 196 lb 1.6 oz (88.95 kg)  Body mass index is 31.67 kg/(m^2).  GENERAL: vitals reviewed and listed above, alert, oriented, appears well hydrated and in no acute distress HEENT: atraumatic, conjunctiva  Right  Red 1+  No ciliary flush , no obvious abnormalities on inspection of external nose and ears OP : no lesion edema or exudate  No lid  Inflammation  NECK: no obvious masses on inspection palpation  MS: moves all extremities without noticeable focal  abnormality PSYCH: pleasant and cooperative, no obvious depression or anxiety   ASSESSMENT AND PLAN:  Discussed the following assessment and plan:  Conjunctivitis of right eye  Elevated BP - pt says ok at home 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  -Patient advised to return or notify health care team  if symptoms worsen ,persist or new concerns arise.  Patient Instructions    Eye drops   For up to a week  Avoid contacts until better .  Bacterial Conjunctivitis Bacterial conjunctivitis, commonly called pink eye, is an inflammation of the clear membrane that covers the white part of the eye (conjunctiva). The inflammation can also happen on the underside of the eyelids. The blood vessels in the conjunctiva become inflamed, causing the eye to become red or pink. Bacterial conjunctivitis may spread easily from one eye to another and from person to person (contagious).  CAUSES  Bacterial conjunctivitis is caused by bacteria. The bacteria may come from your own skin, your upper respiratory tract, or from someone else with bacterial conjunctivitis. SYMPTOMS  The normally white color of the eye or the underside of the eyelid is usually pink or red. The pink eye is usually associated with irritation, tearing, and some sensitivity to light. Bacterial conjunctivitis is often associated with a thick, yellowish discharge from the eye. The discharge may turn into a crust on the eyelids overnight, which causes your eyelids to stick together. If a discharge is present, there may also be some blurred vision in the affected eye. DIAGNOSIS  Bacterial conjunctivitis is diagnosed by your caregiver through an eye exam and the symptoms that you report. Your caregiver looks for changes in the surface tissues of your eyes, which may point to the specific type of conjunctivitis. A sample of any discharge may be collected on a cotton-tip swab if you have a severe case of conjunctivitis, if your cornea is affected, or if you keep getting repeat infections that do not respond to treatment. The sample will be sent to a lab to see if the inflammation is caused by a bacterial infection and to see if the infection will respond to antibiotic medicines. TREATMENT   Bacterial conjunctivitis is treated with antibiotics. Antibiotic eyedrops are most often used. However, antibiotic ointments are also available.  Antibiotics pills are sometimes used. Artificial tears or eye washes may ease discomfort. HOME CARE INSTRUCTIONS   To ease discomfort, apply a cool, clean washcloth to your eye for 10-20 minutes, 3-4 times a day.  Gently wipe away any drainage from your eye with a warm, wet washcloth or a cotton ball.  Wash your hands often with soap and water. Use paper towels to dry your hands.  Do not share towels or washcloths. This may spread the infection.  Change or wash your pillowcase every day.  You should not use eye makeup until the  infection is gone.  Do not operate machinery or drive if your vision is blurred.  Stop using contact lenses. Ask your caregiver how to sterilize or replace your contacts before using them again. This depends on the type of contact lenses that you use.  When applying medicine to the infected eye, do not touch the edge of your eyelid with the eyedrop bottle or ointment tube. SEEK IMMEDIATE MEDICAL CARE IF:   Your infection has not improved within 3 days after beginning treatment.  You had yellow discharge from your eye and it returns.  You have increased eye pain.  Your eye redness is spreading.  Your vision becomes blurred.  You have a fever or persistent symptoms for more than 2-3 days.  You have a fever and your symptoms suddenly get worse.  You have facial pain, redness, or swelling. MAKE SURE YOU:   Understand these instructions.  Will watch your condition.  Will get help right away if you are not doing well or get worse.   This information is not intended to replace advice given to you by your health care provider. Make sure you discuss any questions you have with your health care provider.   Document Released: 03/23/2005 Document Revised: 04/13/2014 Document Reviewed: 08/24/2011 Elsevier Interactive Patient Education 2016 [place] blood pressure was up.  Today .   Check at home to be surea below 140/90     Standley Brooking.  Midas Daughety M.D.

## 2015-05-13 ENCOUNTER — Ambulatory Visit (INDEPENDENT_AMBULATORY_CARE_PROVIDER_SITE_OTHER): Payer: Commercial Managed Care - HMO | Admitting: Internal Medicine

## 2015-05-13 ENCOUNTER — Encounter: Payer: Self-pay | Admitting: Internal Medicine

## 2015-05-13 VITALS — BP 128/62 | HR 61 | Ht 66.0 in | Wt 195.0 lb

## 2015-05-13 DIAGNOSIS — H8113 Benign paroxysmal vertigo, bilateral: Secondary | ICD-10-CM | POA: Diagnosis not present

## 2015-05-13 DIAGNOSIS — H6983 Other specified disorders of Eustachian tube, bilateral: Secondary | ICD-10-CM

## 2015-05-13 MED ORDER — FLUTICASONE PROPIONATE 50 MCG/ACT NA SUSP: NASAL | Status: DC

## 2015-05-13 MED ORDER — MOMETASONE FURO-FORMOTEROL FUM 100-5 MCG/ACT IN AERO: INHALATION_SPRAY | RESPIRATORY_TRACT | Status: DC

## 2015-05-13 MED ORDER — ALBUTEROL SULFATE HFA 108 (90 BASE) MCG/ACT IN AERS: INHALATION_SPRAY | RESPIRATORY_TRACT | Status: DC

## 2015-05-13 NOTE — Progress Notes (Signed)
Patient ID: Elizabeth Pratt, female    DOB: Nov 21, 1960, 55 y.o.   MRN: [id number]  HPI 55 yo female with known hx of asthma  09/08/10 Acute OV  Pr presents for an acute office visit. Complains of prod cough with "clear, slimy" mucus, using resue q2h, fever up to 101, wheezing, increased SOB x3days.  OTC meds not helping. Started with nasal drip and drainage. Wheezing is getting worse.  Cough is keeping her up at night.   05/11/11- 38 yoF former smoker (Dec, 2012) followed for asthma, bronchitis Seen here June 4 by the nurse practitioner. I last saw her 05/09/10. She quit smoking on her own 6 or 7 weeks ago and was not tempted when her smoking sister visited. She has a productive cough with clear phlegm. Using her rescue inhaler about twice a day. Is not as effective/lasting as she wishes. Continues Qvar 40. Change to Zyrtec to Claritin. Dyspnea on exertion with activity much more vigorous than usual ADLs. Denies chest pain, nodes, blood.  PFT 04/30/08- FEV1 2.79/99%, FEV1/FVC 0.75, significant response to bronchodilator, TLC 94%, DLCO 109%.  09/07/11- 17 yoF former smoker (Dec, 2012) followed for asthma, bronchitis Completed prednisone taper-feels better; currently using Mucinex as well to help clear mucus. Had not taken Symbicort due to changes in pharmacies but will transfer asap. Smoking again, blaming "stress". She had called reporting increased bronchitis with cough 2 weeks ago when we had called in prednisone which helped. She just started Mucinex 2 days ago. CXR 05/13/11-  IMPRESSION:  No active cardiopulmonary disease.  Original Report Authenticated By: Jamas Lav, M.D.   03/07/12- 59 yoF smoker followed for asthma, bronchitis FOLLOWS FOR: breathing has improved since last OV. Denies chest pain, tightness or increased SOB. [name] has had flu vaccine. Smoking on and off and says she "quit" again 2 weeks ago. I strongly encouraged her to stay off permanently, emphasizing inpact on her breathing.  We discussed recommended use of Symbicort. Did not get CXR last year because of insurance issue 09/05/12- 32 yoF smoker followed for asthma, bronchitis FOLLOWS FOR: Doing well even through the allergy season Smokes intermittently- less than in past. We discussed this again. Good spring season. Less exposure to school-age children and their colds.  02/23/13- 40 yoF smoker followed for asthma, bronchitis Pt c/o prod cough with clear mucus. She may be smoking less currently but she has not made a real effort to quit now. Recent upper respiratory infection with residual thick mucus, nonpurulent. No fever or sore throat. She feels this is taking long to clear. CXR 05/11/11 IMPRESSION:  No active cardiopulmonary disease.  Original Report Authenticated By: Jamas Lav, M.D.  02/05/14- 33 yoF smoker followed for asthma, bronchitis FOLLOWS [id number] flu shot as she is due for hand procedure soon; would like to get a sample of Proair if we have one-moving things in house and lost hers as well as insurance will not pay for oit yet. Pt states her breathing is doing well-slightly hoarse(out of Zyrtec) otherwise doing well.  Had cold resolved w/o assistance.  Pending trigger finger surgery- discussed timing of flu shot  11/08/14- 53 yoF former smoker followed for asthma, bronchitis Reports: prod cough w/thick mucus; feels like sinus congestion; feels like it is in chest. Quit smoking in December. Preschool teacher with significant viral exposures. Not sick, no shortness of breath. Admits some nasal congestion and mild chest congestion with scant clear sputum  05/13/2015-55 year old female former smoker followed for asthma, bronchitis Vertigo x  1 wk.  No cough, PND, or SOB.   Remains off cigarettes and recognizes significant improvement in cough and chronic bronchitis. Associates mild vertigo with some head congestion. No nausea or headache. Hearing okay. Chronic tinnitus.  ROS-see HPI Constitutional:    No-   weight loss, night sweats, fevers, chills, fatigue, lassitude. HEENT:   No-  headaches, difficulty swallowing, tooth/dental problems, sore throat,       No-  sneezing, itching, ear ache, + some nasal congestion, +post nasal drip,  CV:  No-   chest pain, orthopnea, PND, swelling in lower extremities, anasarca, dizziness, palpitations Resp: +shortness of breath with  + exertion not at rest.          +No-productive cough,  No non-productive cough,  No- coughing up of blood.              No-   change in color of mucus.  No-wheezing.   Skin: No-   rash or lesions. GI:  No-   heartburn, indigestion, abdominal pain, nausea, vomiting,  GU: , MS:  No-   joint pain or swelling.  . Neuro-     nothing unusual Psych:  No- change in mood or affect. No depression or anxiety.  No memory loss.  Objective:  OBJ- Physical Exam- unremarkable General- Alert, Oriented, Affect-appropriate, Distress- none acute, overweight Skin- rash-none, lesions- none, excoriation- none Lymphadenopathy- none Head- atraumatic            Eyes- Gross vision intact, PERRLA, conjunctivae and secretions clear, + mild horizontal nystagmus            Ears- Hearing, canals-normal, + bilateral TMs retracted            Nose-  no-Septal dev, mucus+, no-polyps, erosion, perforation             Throat- Mallampati II , mucosa clear , drainage- none, tonsils- atrophic Neck- flexible , trachea midline, no stridor , thyroid nl, carotid no bruit Chest - symmetrical excursion , unlabored           Heart/CV- RRR , no murmur , no gallop  , no rub, nl s1 s2                           - JVD- none , edema- none, stasis changes- none, varices- none           Lung- clear, cough-none , dullness-none, rub- none           Chest wall-  Abd- Br/ Gen/ Rectal- Not done, not indicated Extrem- cyanosis- none, clubbing, none, atrophy- none, strength- nl Neuro- grossly intact to observation

## 2015-05-13 NOTE — Patient Instructions (Signed)
Scripts sent for refills   We discussed otc meclizine if needed for vertigo

## 2015-05-14 DIAGNOSIS — H6980 Other specified disorders of Eustachian tube, unspecified ear: Secondary | ICD-10-CM | POA: Insufficient documentation

## 2015-05-14 NOTE — Assessment & Plan Note (Signed)
Discussed hydration and use of Sudafed decongestant

## 2015-05-14 NOTE — Assessment & Plan Note (Signed)
I think I'm seeing a little horizontal nystatin less today. Discussed meclizine. It may reflect her eustachian dysfunction

## 2015-07-24 ENCOUNTER — Encounter: Payer: Self-pay | Admitting: Internal Medicine

## 2015-07-24 ENCOUNTER — Ambulatory Visit (INDEPENDENT_AMBULATORY_CARE_PROVIDER_SITE_OTHER): Payer: Commercial Managed Care - HMO | Admitting: Internal Medicine

## 2015-07-24 VITALS — BP 150/92 | HR 69 | Temp 98.3°F

## 2015-07-24 DIAGNOSIS — K649 Unspecified hemorrhoids: Secondary | ICD-10-CM

## 2015-07-24 MED ORDER — HYDROCORTISONE ACE-PRAMOXINE 1-1 % RE CREA: 1.0000 "application " | TOPICAL_CREAM | Freq: Two times a day (BID) | RECTAL | Status: DC

## 2015-07-24 NOTE — Patient Instructions (Addendum)
This acts like a clotted off hemorrhoid but  Looks like it is resolving and normal colored skin .,  Can add a topical cream for comfort but if continuing pain  And not resolving we can send you to a surgeon or gi who deals with hemorrhoids .for removal .    Hemorrhoids Hemorrhoids are swollen veins around the rectum or anus. There are two types of hemorrhoids:   Internal hemorrhoids. These occur in the veins just inside the rectum. They may poke through to the outside and become irritated and painful.  External hemorrhoids. These occur in the veins outside the anus and can be felt as a painful swelling or hard lump near the anus. CAUSES  Pregnancy.   Obesity.   Constipation or diarrhea.   Straining to have a bowel movement.   Sitting for long periods on the toilet.  Heavy lifting or other activity that caused you to strain.  Anal intercourse. SYMPTOMS   Pain.   Anal itching or irritation.   Rectal bleeding.   Fecal leakage.   Anal swelling.   One or more lumps around the anus.  DIAGNOSIS  Your caregiver may be able to diagnose hemorrhoids by visual examination. Other examinations or tests that may be performed include:   Examination of the rectal area with a gloved hand (digital rectal exam).   Examination of anal canal using a small tube (scope).   A blood test if you have lost a significant amount of blood.  A test to look inside the colon (sigmoidoscopy or colonoscopy). TREATMENT Most hemorrhoids can be treated at home. However, if symptoms do not seem to be getting better or if you have a lot of rectal bleeding, your caregiver may perform a procedure to help make the hemorrhoids get smaller or remove them completely. Possible treatments include:   Placing a rubber band at the base of the hemorrhoid to cut off the circulation (rubber band ligation).   Injecting a chemical to shrink the hemorrhoid (sclerotherapy).   Using a tool to burn the  hemorrhoid (infrared light therapy).   Surgically removing the hemorrhoid (hemorrhoidectomy).   Stapling the hemorrhoid to block blood flow to the tissue (hemorrhoid stapling).  HOME CARE INSTRUCTIONS   Eat foods with fiber, such as whole grains, beans, nuts, fruits, and vegetables. Ask your doctor about taking products with added fiber in them (fibersupplements).  Increase fluid intake. Drink enough water and fluids to keep your urine clear or pale yellow.   Exercise regularly.   Go to the bathroom when you have the urge to have a bowel movement. Do not wait.   Avoid straining to have bowel movements.   Keep the anal area dry and clean. Use wet toilet paper or moist towelettes after a bowel movement.   Medicated creams and suppositories may be used or applied as directed.   Only take over-the-counter or prescription medicines as directed by your caregiver.   Take warm sitz baths for 15-20 minutes, 3-4 times a day to ease pain and discomfort.   Place ice packs on the hemorrhoids if they are tender and swollen. Using ice packs between sitz baths may be helpful.   Put ice in a plastic bag.   Place a towel between your skin and the bag.   Leave the ice on for 15-20 minutes, 3-4 times a day.   Do not use a donut-shaped pillow or sit on the toilet for long periods. This increases blood pooling and pain.  [place]  CARE IF:  You have increasing pain and swelling that is not controlled by treatment or medicine.  You have uncontrolled bleeding.  You have difficulty or you are unable to have a bowel movement.  You have pain or inflammation outside the area of the hemorrhoids. MAKE SURE YOU:  Understand these instructions.  Will watch your condition.  Will get help right away if you are not doing well or get worse.   This information is not intended to replace advice given to you by your health care provider. Make sure you discuss any questions you have  with your health care provider.   Document Released: 03/20/2000 Document Revised: 03/09/2012 Document Reviewed: 01/26/2012 Elsevier Interactive Patient Education [employer].

## 2015-07-24 NOTE — Progress Notes (Signed)
Pre visit review using our clinic review tool, if applicable. No additional management support is needed unless otherwise documented below in the visit note.  Chief Complaint  Patient presents with  . Hemorrhoids    OTC cream    HPI: Elizabeth Pratt 55 y.o.  comesin for sda appt.   Last Friday   Tender area and protruding  Without bleeding and   3 days ago   Worse and  Prep h using which hazel and    2 days ago got worse .     Mowed the lawn and more tender .   Since then andvaried   in protrusion and  Marble size    Pain  advil     400.  For pain  No vomiiting bleeding hx of same    ROS: See pertinent positives and negatives per HPI.  Past Medical History  Diagnosis Date  . Asthma dx age 31  . Pneumonia   . Vertigo   . Migraines     poss atypical ? MRI  . Diverticulitis   . Diverticulosis   . Obesity (BMI 30-39.9) 03/26/2014    Family History  Problem Relation Age of Onset  . Asthma Mother   . Breast cancer Mother   . Asthma Sister     smoker  . Asthma Sister     smoker  . Breast cancer Sister   . Ovarian cancer Sister   . ADD / ADHD Son     Social History   Social History  . Marital Status: Single    Spouse Name: N/A  . Number of Children: N/A  . Years of Education: N/A   Occupational History  . Aeronautical engineer at [employer]  . Smoking status: Former Smoker -- 0.10 packs/day    Types: Cigarettes    Quit date: 03/30/2014  . Smokeless tobacco: None     Comment: has quit "lots of times"  . Alcohol Use: Yes  . Drug Use: No  . Sexual Activity: Not Asked   Other Topics Concern  . 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 albuterol (PROAIR HFA) 108 (90 Base) MCG/ACT inhaler 2 puffs every 6 hours if needed 54 each 3  . cetirizine (ZYRTEC) 10 MG tablet Take 10 mg by mouth daily.    .  cholecalciferol (VITAMIN D) 1000 UNITS tablet Take 1,000 Units by mouth daily.    . Cyanocobalamin (B-12) 1000 MCG CAPS Take by mouth every other day.     . fluticasone (FLONASE) 50 MCG/ACT nasal spray 1-2 puffs each nostril once daily 48 g 3  . mometasone-formoterol (DULERA) 100-5 MCG/ACT AERO 2 puffs then rinse mouth, twice daily 3 Inhaler 3   No facility-administered medications prior to visit.     EXAM:  BP 150/92 mmHg  Pulse 69  Temp(Src) 98.3 F (36.8 C) (Oral)  Wt   SpO2 98%  There is no weight on file to calculate BMI.  GENERAL: vitals reviewed and listed above, alert, oriented, appears well hydrated and in no acute distress HEENT: atraumatic, conjunctiva  clear, no obvious abnormalities on inspection of external nose  Ext gu rectal area    3 cm flesh colored   Firm midly soft ( not firm)  flsh colored  Tag hemorrhoid area   No  blood or other masses this is at about 7 pm  MS: moves all extremities without noticeable focal  abnormality PSYCH: pleasant and cooperative, no obvious depression or anxiety  ASSESSMENT AND PLAN:  Discussed the following assessment and plan:  Acute hemorrhoid - nl color  poss  late thrombosis  local care options  and can refer  if needed  but doesnt seem like needs surg procedure at this time     -Patient advised to return or notify health care team  if symptoms worsen ,persist or new concerns arise.  Patient Instructions  This acts like a clotted off hemorrhoid but  Looks like it is resolving and normal colored skin .,  Can add a topical cream for comfort but if continuing pain  And not resolving we can send you to a surgeon or gi who deals with hemorrhoids .for removal .    Hemorrhoids Hemorrhoids are swollen veins around the rectum or anus. There are two types of hemorrhoids:   Internal hemorrhoids. These occur in the veins just inside the rectum. They may poke through to the outside and become irritated and painful.  External  hemorrhoids. These occur in the veins outside the anus and can be felt as a painful swelling or hard lump near the anus. CAUSES  Pregnancy.   Obesity.   Constipation or diarrhea.   Straining to have a bowel movement.   Sitting for long periods on the toilet.  Heavy lifting or other activity that caused you to strain.  Anal intercourse. SYMPTOMS   Pain.   Anal itching or irritation.   Rectal bleeding.   Fecal leakage.   Anal swelling.   One or more lumps around the anus.  DIAGNOSIS  Your caregiver may be able to diagnose hemorrhoids by visual examination. Other examinations or tests that may be performed include:   Examination of the rectal area with a gloved hand (digital rectal exam).   Examination of anal canal using a small tube (scope).   A blood test if you have lost a significant amount of blood.  A test to look inside the colon (sigmoidoscopy or colonoscopy). TREATMENT Most hemorrhoids can be treated at home. However, if symptoms do not seem to be getting better or if you have a lot of rectal bleeding, your caregiver may perform a procedure to help make the hemorrhoids get smaller or remove them completely. Possible treatments include:   Placing a rubber band at the base of the hemorrhoid to cut off the circulation (rubber band ligation).   Injecting a chemical to shrink the hemorrhoid (sclerotherapy).   Using a tool to burn the hemorrhoid (infrared light therapy).   Surgically removing the hemorrhoid (hemorrhoidectomy).   Stapling the hemorrhoid to block blood flow to the tissue (hemorrhoid stapling).  HOME CARE INSTRUCTIONS   Eat foods with fiber, such as whole grains, beans, nuts, fruits, and vegetables. Ask your doctor about taking products with added fiber in them (fibersupplements).  Increase fluid intake. Drink enough water and fluids to keep your urine clear or pale yellow.   Exercise regularly.   Go to the bathroom when  you have the urge to have a bowel movement. Do not wait.   Avoid straining to have bowel movements.   Keep the anal area dry and clean. Use wet toilet paper or moist towelettes after a bowel movement.   Medicated creams and suppositories may be used or applied as directed.   Only take over-the-counter or prescription medicines as directed by  your caregiver.   Take warm sitz baths for 15-20 minutes, 3-4 times a day to ease pain and discomfort.   Place ice packs on the hemorrhoids if they are tender and swollen. Using ice packs between sitz baths may be helpful.   Put ice in a plastic bag.   Place a towel between your skin and the bag.   Leave the ice on for 15-20 minutes, 3-4 times a day.   Do not use a donut-shaped pillow or sit on the toilet for long periods. This increases blood pooling and pain.  SEEK MEDICAL CARE IF:  You have increasing pain and swelling that is not controlled by treatment or medicine.  You have uncontrolled bleeding.  You have difficulty or you are unable to have a bowel movement.  You have pain or inflammation outside the area of the hemorrhoids. MAKE SURE YOU:  Understand these instructions.  Will watch your condition.  Will get help right away if you are not doing well or get worse.   This information is not intended to replace advice given to you by your health care provider. Make sure you discuss any questions you have with your health care provider.   Document Released: 03/20/2000 Document Revised: 03/09/2012 Document Reviewed: 01/26/2012 Elsevier Interactive Patient Education 2016 Montgomery K. Panosh M.D.

## 2015-08-29 ENCOUNTER — Other Ambulatory Visit: Payer: Self-pay | Admitting: Obstetrics & Gynecology

## 2015-09-03 LAB — CYTOLOGY - PAP

## 2015-09-27 ENCOUNTER — Encounter: Payer: Self-pay | Admitting: Internal Medicine

## 2015-09-27 ENCOUNTER — Ambulatory Visit (INDEPENDENT_AMBULATORY_CARE_PROVIDER_SITE_OTHER): Payer: Commercial Managed Care - HMO | Admitting: Internal Medicine

## 2015-09-27 ENCOUNTER — Telehealth: Payer: Self-pay | Admitting: Internal Medicine

## 2015-09-27 VITALS — BP 136/78 | HR 89 | Ht 66.0 in | Wt 180.0 lb

## 2015-09-27 DIAGNOSIS — J018 Other acute sinusitis: Secondary | ICD-10-CM | POA: Diagnosis not present

## 2015-09-27 DIAGNOSIS — J019 Acute sinusitis, unspecified: Secondary | ICD-10-CM | POA: Insufficient documentation

## 2015-09-27 MED ORDER — DOXYCYCLINE HYCLATE 100 MG PO TABS: 100.0000 mg | ORAL_TABLET | Freq: Two times a day (BID) | ORAL | Status: DC

## 2015-09-27 NOTE — Progress Notes (Signed)
Subjective:     Patient ID: Elizabeth Pratt, female   DOB: [birth year], 55 y.o.   MRN: [id number]  PCP .Lottie Dawson, MD   HPI   OV 09/27/2015  Chief Complaint  Patient presents with  . Acute Visit    CY pt.  Pt flew X2 wks ago, had R ear pain after flying- now is having sinus congestion, sore throat, forced prod cough with green mucus.     55 year old female with moderate persistent asthma by history on chronic Dulera. She also has eustachian tube dysfunction. Approximately 2 weeks ago the flight up [place] and then during descent had ear pain. She subsequently came back to [place], [place] where she lives. Had been doing well for approximately 10 days ago started noticing some mild sinus congestion than several days ago sinus drainage with green. Then 2 days ago started having sore throat with change in quality of voice he did then yesterday started having wheezing and also some green sputum. Therefore she came in acutely today. She wants to avoid prednisone at all costs because this causes weight gain. At the most she says she will try to just get better with antibiotics and if she doesn't then she will just take one shot of Depo-Medrol. She does not have fever or hemoptysis     has a past medical history of Asthma (dx age 89); Pneumonia; Vertigo; Migraines; Diverticulitis; Diverticulosis; and Obesity (BMI 30-39.9) (03/26/2014).   reports that she quit smoking about 17 months ago. Her smoking use included Cigarettes. She smoked 0.10 packs per day. She does not have any smokeless tobacco history on file.  Past Surgical History  Procedure Laterality Date  . Dental extractions    . Dental extractions      Allergies  Allergen Reactions  . Dilaudid [Hydromorphone Hcl] Nausea And Vomiting  . Meloxicam Other (See Comments)    REACTION: dizziness  . Penicillins Other (See Comments)    Reaction Unknown: runs in family  . Sulfonamide Derivatives Nausea And Vomiting     Immunization History  Administered Date(s) Administered  . Influenza Split 01/06/2012  . Influenza Whole 03/07/2011  . Influenza,inj,Quad PF,36+ Mos 02/23/2013, 02/05/2014    Family History  Problem Relation Age of Onset  . Asthma Mother   . Breast cancer Mother   . Asthma Sister     smoker  . Asthma Sister     smoker  . Breast cancer Sister   . Ovarian cancer Sister   . ADD / ADHD Son      Current outpatient prescriptions:  .  albuterol (PROAIR HFA) 108 (90 Base) MCG/ACT inhaler, 2 puffs every 6 hours if needed, Disp: 54 each, Rfl: 3 .  cetirizine (ZYRTEC) 10 MG tablet, Take 10 mg by mouth daily., Disp: , Rfl:  .  cholecalciferol (VITAMIN D) 1000 UNITS tablet, Take 1,000 Units by mouth daily., Disp: , Rfl:  .  Cyanocobalamin (B-12) 1000 MCG CAPS, Take by mouth every other day. , Disp: , Rfl:  .  fluticasone (FLONASE) 50 MCG/ACT nasal spray, 1-2 puffs each nostril once daily, Disp: 48 g, Rfl: 3 .  mometasone-formoterol (DULERA) 100-5 MCG/ACT AERO, 2 puffs then rinse mouth, twice daily, Disp: 3 Inhaler, Rfl: 3     Review of Systems     Objective:   Physical Exam  Constitutional: She is oriented to person, place, and time. She appears well-developed and well-nourished. No distress.  HENT:  Head: Normocephalic and atraumatic.  Right Ear: External ear normal.  Left  Ear: External ear normal.  Mouth/Throat: Oropharynx is clear and moist. No oropharyngeal exudate.  Mild hoarse voice Normal ear exam Significant postnasal drip present Right nostril with dry mucus  Eyes: Conjunctivae and EOM are normal. Pupils are equal, round, and reactive to light. Right eye exhibits no discharge. Left eye exhibits no discharge. No scleral icterus.  Neck: Normal range of motion. Neck supple. No JVD present. No tracheal deviation present. No thyromegaly present.  Cardiovascular: Normal rate, regular rhythm, normal heart sounds and intact distal pulses.  Exam reveals no gallop and no  friction rub.   No murmur heard. Pulmonary/Chest: Effort normal and breath sounds normal. No respiratory distress. She has no wheezes. She has no rales. She exhibits no tenderness.  Abdominal: Soft. Bowel sounds are normal. She exhibits no distension and no mass. There is no tenderness. There is no rebound and no guarding.  Musculoskeletal: Normal range of motion. She exhibits no edema or tenderness.  Lymphadenopathy:    She has no cervical adenopathy.  Neurological: She is alert and oriented to person, place, and time. She has normal reflexes. No cranial nerve deficit. She exhibits normal muscle tone. Coordination normal.  Skin: Skin is warm and dry. No rash noted. She is not diaphoretic. No erythema. No pallor.  Psychiatric: She has a normal mood and affect. Her behavior is normal. Judgment and thought content normal.  Vitals reviewed.   Filed Vitals:   09/27/15 1219  BP: 136/78  Pulse: 89  Height: 5\' 6"  (1.676 m)  Weight: 180 lb (81.647 kg)  SpO2: 97%    Estimated body mass index is 29.07 kg/(m^2) as calculated from the following:   Height as of this encounter: 5\' 6"  (1.676 m).   Weight as of this encounter: 180 lb (81.647 kg).       Assessment:       ICD-9-CM ICD-10-CM   1. Other acute sinusitis 461.8 J01.80        Plan:      Take doxycycline 100mg  po twice daily x 7 days; take after meals and avoid sunlight  If not getting better or getting worse call [phone] anytime and we can consider antibiotic change or steroids   Dr. Brand Males, M.D., [facility].C.P Pulmonary and Critical Care Medicine Staff Physician [facility] Pager: [phone], If no answer or between  15:00h - 7:00h: call [phone]  09/27/2015 12:45 PM

## 2015-09-27 NOTE — Telephone Encounter (Signed)
Patient calling to get appointment to be seen today.  Patient scheduled to see Dr. Chase Caller at noon today. Patient aware of appointment.  Nothing further needed.

## 2015-09-27 NOTE — Patient Instructions (Addendum)
ICD-9-CM ICD-10-CM   1. Other acute sinusitis 461.8 J01.80    Take doxycycline 100mg  po twice daily x 7 days; take after meals and avoid sunlight  If not getting better or getting worse call [phone] anytime and we can consider antibiotic change or steroids

## 2015-11-18 ENCOUNTER — Encounter: Payer: Self-pay | Admitting: Internal Medicine

## 2015-11-18 ENCOUNTER — Ambulatory Visit (INDEPENDENT_AMBULATORY_CARE_PROVIDER_SITE_OTHER): Payer: Commercial Managed Care - HMO | Admitting: Internal Medicine

## 2015-11-18 VITALS — BP 162/92 | HR 73 | Temp 98.3°F

## 2015-11-18 DIAGNOSIS — H1033 Unspecified acute conjunctivitis, bilateral: Secondary | ICD-10-CM

## 2015-11-18 DIAGNOSIS — IMO0001 Reserved for inherently not codable concepts without codable children: Secondary | ICD-10-CM

## 2015-11-18 DIAGNOSIS — R030 Elevated blood-pressure reading, without diagnosis of hypertension: Secondary | ICD-10-CM | POA: Diagnosis not present

## 2015-11-18 DIAGNOSIS — Z973 Presence of spectacles and contact lenses: Secondary | ICD-10-CM | POA: Diagnosis not present

## 2015-11-18 MED ORDER — CIPROFLOXACIN HCL 0.3 % OP SOLN: OPHTHALMIC | 0 refills | Status: DC

## 2015-11-18 NOTE — Patient Instructions (Addendum)
Antibiotic  Topical every 2-3 hours while awake . If getting fever pain vision changes  or worsein the next 48 hours need reevaluation  .  opthalmologiy also  If needed.  No contacts  Until all better . Your BP is up at this visit today    Bacterial Conjunctivitis Bacterial conjunctivitis, commonly called pink eye, is an inflammation of the clear membrane that covers the white part of the eye (conjunctiva). The inflammation can also happen on the underside of the eyelids. The blood vessels in the conjunctiva become inflamed, causing the eye to become red or pink. Bacterial conjunctivitis may spread easily from one eye to another and from person to person (contagious).  CAUSES  Bacterial conjunctivitis is caused by bacteria. The bacteria may come from your own skin, your upper respiratory tract, or from someone else with bacterial conjunctivitis. SYMPTOMS  The normally white color of the eye or the underside of the eyelid is usually pink or red. The pink eye is usually associated with irritation, tearing, and some sensitivity to light. Bacterial conjunctivitis is often associated with a thick, yellowish discharge from the eye. The discharge may turn into a crust on the eyelids overnight, which causes your eyelids to stick together. If a discharge is present, there may also be some blurred vision in the affected eye. DIAGNOSIS  Bacterial conjunctivitis is diagnosed by your caregiver through an eye exam and the symptoms that you report. Your caregiver looks for changes in the surface tissues of your eyes, which may point to the specific type of conjunctivitis. A sample of any discharge may be collected on a cotton-tip swab if you have a severe case of conjunctivitis, if your cornea is affected, or if you keep getting repeat infections that do not respond to treatment. The sample will be sent to a lab to see if the inflammation is caused by a bacterial infection and to see if the infection will respond  to antibiotic medicines. TREATMENT   Bacterial conjunctivitis is treated with antibiotics. Antibiotic eyedrops are most often used. However, antibiotic ointments are also available. Antibiotics pills are sometimes used. Artificial tears or eye washes may ease discomfort. HOME CARE INSTRUCTIONS   To ease discomfort, apply a cool, clean washcloth to your eye for 10-20 minutes, 3-4 times a day.  Gently wipe away any drainage from your eye with a warm, wet washcloth or a cotton ball.  Wash your hands often with soap and water. Use paper towels to dry your hands.  Do not share towels or washcloths. This may spread the infection.  Change or wash your pillowcase every day.  You should not use eye makeup until the infection is gone.  Do not operate machinery or drive if your vision is blurred.  Stop using contact lenses. Ask your caregiver how to sterilize or replace your contacts before using them again. This depends on the type of contact lenses that you use.  When applying medicine to the infected eye, do not touch the edge of your eyelid with the eyedrop bottle or ointment tube. SEEK IMMEDIATE MEDICAL CARE IF:   Your infection has not improved within 3 days after beginning treatment.  You had yellow discharge from your eye and it returns.  You have increased eye pain.  Your eye redness is spreading.  Your vision becomes blurred.  You have a fever or persistent symptoms for more than 2-3 days.  You have a fever and your symptoms suddenly get worse.  You have facial pain, redness,  or swelling. MAKE SURE YOU:   Understand these instructions.  Will watch your condition.  Will get help right away if you are not doing well or get worse.   This information is not intended to replace advice given to you by your health care provider. Make sure you discuss any questions you have with your health care provider.   Document Released: 03/23/2005 Document Revised: 04/13/2014 Document  Reviewed: 08/24/2011 Elsevier Interactive Patient Education [employer].

## 2015-11-18 NOTE — Progress Notes (Signed)
Pre visit review using our clinic review tool, if applicable. No additional management support is needed unless otherwise documented below in the visit note.  Chief Complaint  Patient presents with  . Eye Problem    Bilateral eye redness, weeping itching and pus    HPI: Elizabeth Pratt 55 y.o.  sda appt Got exposed to pinkeye for a couple children in preschool. About 3036 hours ago she awoke with eye redness and weeping but got severely worse with irritation and thick white yellow pus. No major vision changes or photophobia but blurriness. She is a contact wearer but hasn't been wearing them since the onset of this problem. Has an underlying minor cough but no fever respiratory infection symptoms. Area around her eyes are very irritated. Her blood pressure had been okay as far she knows. She began using leftover drops of Polytrim that she got in January and has had 6 doses so far. Slightly better now although was worse in the last 12 hours. ROS: See pertinent positives and negatives per HPI.  Past Medical History:  Diagnosis Date  . Asthma dx age 57  . Diverticulitis   . Diverticulosis   . Migraines    poss atypical ? MRI  . Obesity (BMI 30-39.9) 03/26/2014  . Pneumonia   . Vertigo     Family History  Problem Relation Age of Onset  . Asthma Mother   . Breast cancer Mother   . Asthma Sister     smoker  . Asthma Sister     smoker  . Breast cancer Sister   . Ovarian cancer Sister   . ADD / ADHD Son     Social History   Social History  . Marital status: Single    Spouse name: N/A  . Number of children: N/A  . Years of education: N/A   Occupational History  . 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 Smoking status: Former Smoker    Packs/day: 0.10    Types: Cigarettes    Quit date: 03/30/2014  . Smokeless tobacco: None     Comment: has quit "lots of times"  . Alcohol use Yes  . Drug use: No  . Sexual activity: Not Asked   Other Topics  Concern  . 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   dog    Outpatient Medications Prior to Visit  Medication Sig Dispense Refill  . albuterol (PROAIR HFA) 108 (90 Base) MCG/ACT inhaler 2 puffs every 6 hours if needed 54 each 3  . cetirizine (ZYRTEC) 10 MG tablet Take 10 mg by mouth daily.    . cholecalciferol (VITAMIN D) 1000 UNITS tablet Take 1,000 Units by mouth daily.    . Cyanocobalamin (B-12) 1000 MCG CAPS Take by mouth every other day.     [name] doxycycline (VIBRA-TABS) 100 MG tablet Take 1 tablet (100 mg total) by mouth 2 (two) times daily after a meal. 14 tablet 0  . fluticasone (FLONASE) 50 MCG/ACT nasal spray 1-2 puffs each nostril once daily 48 g 3  . mometasone-formoterol (DULERA) 100-5 MCG/ACT AERO 2 puffs then rinse mouth, twice daily 3 Inhaler 3   No facility-administered medications prior to visit.      EXAM:  BP (!) 162/92 (BP Location: Right Arm, Patient Position: Sitting, Cuff Size: Normal)   Pulse 73   Temp 98.3 F (36.8 C) (Oral)   SpO2 98%  There is no height or weight on file to calculate BMI.  GENERAL: vitals reviewed and listed above, alert, oriented, appears well hydrated and in no acute distressShe has obvious redness around the eyes without lid discharge conjunctiva slightly edematous PERRLA no ciliary flush or photophobia. +2 redness with hyperkalemia and sick white discharge. There is no crusting. There is no edema. EACs and TMs have normal landmarks. OP clear no acute lesions. Neck supple without adenopathy.PSYCH: pleasant and cooperative, no obvious depression or anxiety  ASSESSMENT AND PLAN:  Discussed the following assessment and plan:  Acute conjunctivitis of both eyes - presumed bacterial polytrim may work but change to broader spectrum cipro drops expectant management and fu see text.  Wears contact lenses - not currently   Elevated BP - recheck when out of office  -Patient advised to  return or notify health care team  if symptoms worsen ,persist or new concerns arise.  Patient Instructions  Antibiotic  Topical every 2-3 hours while awake . If getting fever pain vision changes  or worsein the next 48 hours need reevaluation  .  opthalmologiy also  If needed.  No contacts  Until all better . Your BP is up at this visit today    Bacterial Conjunctivitis Bacterial conjunctivitis, commonly called pink eye, is an inflammation of the clear membrane that covers the white part of the eye (conjunctiva). The inflammation can also happen on the underside of the eyelids. The blood vessels in the conjunctiva become inflamed, causing the eye to become red or pink. Bacterial conjunctivitis may spread easily from one eye to another and from person to person (contagious).  CAUSES  Bacterial conjunctivitis is caused by bacteria. The bacteria may come from your own skin, your upper respiratory tract, or from someone else with bacterial conjunctivitis. SYMPTOMS  The normally white color of the eye or the underside of the eyelid is usually pink or red. The pink eye is usually associated with irritation, tearing, and some sensitivity to light. Bacterial conjunctivitis is often associated with a thick, yellowish discharge from the eye. The discharge may turn into a crust on the eyelids overnight, which causes your eyelids to stick together. If a discharge is present, there may also be some blurred vision in the affected eye. DIAGNOSIS  Bacterial conjunctivitis is diagnosed by your caregiver through an eye exam and the symptoms that you report. Your caregiver looks for changes in the surface tissues of your eyes, which may point to the specific type of conjunctivitis. A sample of any discharge may be collected on a cotton-tip swab if you have a severe case of conjunctivitis, if your cornea is affected, or if you keep getting repeat infections that do not respond to treatment. The sample will be sent  to a lab to see if the inflammation is caused by a bacterial infection and to see if the infection will respond to antibiotic medicines. TREATMENT   Bacterial conjunctivitis is treated with antibiotics. Antibiotic eyedrops are most often used. However, antibiotic ointments are also available. Antibiotics pills are sometimes used. Artificial tears or eye washes may ease discomfort. HOME CARE INSTRUCTIONS   To ease discomfort, apply a cool, clean washcloth to your eye for 10-20 minutes, 3-4 times a day.  Gently wipe away any drainage from your eye with a warm, wet washcloth or a cotton ball.  Wash your hands often with soap and water. Use paper towels to dry your hands.  Do not share towels or washcloths. This may spread the  infection.  Change or wash your pillowcase every day.  You should not use eye makeup until the infection is gone.  Do not operate machinery or drive if your vision is blurred.  Stop using contact lenses. Ask your caregiver how to sterilize or replace your contacts before using them again. This depends on the type of contact lenses that you use.  When applying medicine to the infected eye, do not touch the edge of your eyelid with the eyedrop bottle or ointment tube. SEEK IMMEDIATE MEDICAL CARE IF:   Your infection has not improved within 3 days after beginning treatment.  You had yellow discharge from your eye and it returns.  You have increased eye pain.  Your eye redness is spreading.  Your vision becomes blurred.  You have a fever or persistent symptoms for more than 2-3 days.  You have a fever and your symptoms suddenly get worse.  You have facial pain, redness, or swelling. MAKE SURE YOU:   Understand these instructions.  Will watch your condition.  Will get help right away if you are not doing well or get worse.   This information is not intended to replace advice given to you by your health care provider. Make sure you discuss any questions  you have with your health care provider.   Document Released: 03/23/2005 Document Revised: 04/13/2014 Document Reviewed: 08/24/2011 Elsevier Interactive Patient Education 2016 Ripley K. Mckinsey Keagle M.D.

## 2015-12-29 ENCOUNTER — Emergency Department (HOSPITAL_COMMUNITY)
Admission: EM | Admit: 2015-12-29 | Discharge: 2015-12-30 | Disposition: A | Discharge status: Home / Self Care | Payer: Commercial Managed Care - HMO | Attending: Emergency Medicine | Admitting: Emergency Medicine

## 2015-12-29 ENCOUNTER — Emergency Department (HOSPITAL_COMMUNITY): Payer: Commercial Managed Care - HMO

## 2015-12-29 ENCOUNTER — Encounter (HOSPITAL_COMMUNITY): Payer: Self-pay

## 2015-12-29 DIAGNOSIS — Z87891 Personal history of nicotine dependence: Secondary | ICD-10-CM | POA: Insufficient documentation

## 2015-12-29 DIAGNOSIS — R1031 Right lower quadrant pain: Secondary | ICD-10-CM

## 2015-12-29 DIAGNOSIS — J45909 Unspecified asthma, uncomplicated: Secondary | ICD-10-CM | POA: Insufficient documentation

## 2015-12-29 DIAGNOSIS — N132 Hydronephrosis with renal and ureteral calculous obstruction: Secondary | ICD-10-CM | POA: Insufficient documentation

## 2015-12-29 DIAGNOSIS — N200 Calculus of kidney: Secondary | ICD-10-CM

## 2015-12-29 LAB — COMPREHENSIVE METABOLIC PANEL
ALBUMIN: 3.7 g/dL (ref 3.5–5.0)
ALT: 25 U/L (ref 14–54)
AST: 26 U/L (ref 15–41)
Alkaline Phosphatase: 59 U/L (ref 38–126)
Anion gap: 13 (ref 5–15)
BUN: 10 mg/dL (ref 6–20)
CHLORIDE: 106 mmol/L (ref 101–111)
CO2: 18 mmol/L — ABNORMAL LOW (ref 22–32)
Calcium: 9 mg/dL (ref 8.9–10.3)
Creatinine, Ser: 0.8 mg/dL (ref 0.44–1.00)
GFR calc Af Amer: >60 mL/min (ref >60)
GFR calc non Af Amer: >60 mL/min (ref >60)
GLUCOSE: 117 mg/dL — AB (ref 65–99)
POTASSIUM: 3.2 mmol/L — AB (ref 3.5–5.1)
SODIUM: 137 mmol/L (ref 135–145)
Total Bilirubin: 0.4 mg/dL (ref 0.3–1.2)
Total Protein: 6.4 g/dL — ABNORMAL LOW (ref 6.5–8.1)

## 2015-12-29 LAB — URINALYSIS, ROUTINE W REFLEX MICROSCOPIC
Bilirubin Urine: NEGATIVE
GLUCOSE, UA: NEGATIVE mg/dL
KETONES UR: NEGATIVE mg/dL
Nitrite: NEGATIVE
PH: 6.5 (ref 5.0–8.0)
Protein, ur: NEGATIVE mg/dL
Specific Gravity, Urine: 1.01 (ref 1.005–1.030)

## 2015-12-29 LAB — CBC
HEMATOCRIT: 38.6 % (ref 36.0–46.0)
Hemoglobin: 12.7 g/dL (ref 12.0–15.0)
MCH: 29.2 pg (ref 26.0–34.0)
MCHC: 32.9 g/dL (ref 30.0–36.0)
MCV: 88.7 fL (ref 78.0–100.0)
Platelets: 222 10*3/uL (ref 150–400)
RBC: 4.35 MIL/uL (ref 3.87–5.11)
RDW: 12.5 % (ref 11.5–15.5)
WBC: 5.5 10*3/uL (ref 4.0–10.5)

## 2015-12-29 LAB — URINE MICROSCOPIC-ADD ON

## 2015-12-29 LAB — LIPASE, BLOOD: LIPASE: 28 U/L (ref 11–51)

## 2015-12-29 MED ORDER — ONDANSETRON HCL 4 MG/2ML IJ SOLN
4.0000 mg | Freq: Once | INTRAMUSCULAR | Status: AC | PRN
  Administered 2015-12-29: 4 mg via INTRAVENOUS
  Filled 2015-12-29: qty 2

## 2015-12-29 MED ORDER — SODIUM CHLORIDE 0.9 % IV BOLUS (SEPSIS)
1000.0000 mL | Freq: Once | INTRAVENOUS | Status: AC
  Administered 2015-12-29: 1000 mL via INTRAVENOUS

## 2015-12-29 MED ORDER — IOPAMIDOL (ISOVUE-300) INJECTION 61%
INTRAVENOUS | Status: AC
  Administered 2015-12-29: 100 mL
  Filled 2015-12-29: qty 100

## 2015-12-29 MED ORDER — FENTANYL CITRATE (PF) 100 MCG/2ML IJ SOLN
75.0000 ug | Freq: Once | INTRAMUSCULAR | Status: AC
  Administered 2015-12-29: 75 ug via INTRAVENOUS
  Filled 2015-12-29: qty 2

## 2015-12-29 NOTE — ED Triage Notes (Signed)
Pt from home by [facility] for RLQ pain that radiates around the back. Pt c/om of 7-10 pain after 98mcg of fentinal. With a little relief.

## 2015-12-29 NOTE — ED Notes (Signed)
Patient transported to CT 

## 2015-12-29 NOTE — ED Provider Notes (Signed)
[facility] DEPT Provider Note   CSN: [id number] Arrival date & time: 12/29/15  2150     History   Chief Complaint Chief Complaint  Patient presents with  . Abdominal Pain    HPI Elizabeth Pratt is a 55 y.o. female.  The history is provided by the patient.  Abdominal Pain   This is a new problem. The problem occurs constantly. The problem has been rapidly improving. The pain is associated with an unknown factor. The pain is located in the RLQ. The quality of the pain is sharp. The pain is severe. Associated symptoms include nausea. Pertinent negatives include anorexia, fever, diarrhea, flatus, hematochezia, melena, vomiting, constipation, dysuria, hematuria, headaches and arthralgias. The symptoms are aggravated by palpation, certain positions and activity. The symptoms are relieved by being still. Past medical history comments: Diverticulitis.    Past Medical History:  Diagnosis Date  . Asthma dx age 47  . Diverticulitis   . Diverticulosis   . Migraines    poss atypical ? MRI  . Obesity (BMI 30-39.9) 03/26/2014  . Pneumonia   . Vertigo     Patient Active Problem List   Diagnosis Date Noted  . Acute sinusitis 09/27/2015  . Eustachian tube dysfunction 05/14/2015  . Fatty liver 03/26/2014  . Renal stone 03/26/2014  . Acute bronchitis 03/26/2014  . Diverticulitis 03/25/2014  . Sigmoid diverticulitis 03/25/2014  . Asthma, chronic 03/25/2014  . Acute upper respiratory infections of unspecified site 03/12/2013  . Family history of thyroid disease 07/24/2012  . Hair thinning 07/24/2012  . Acute infective gastroenteritis 02/25/2011  . ATTENTION OR CONCENTRATION DEFICIT 09/24/2009  . BENIGN PAROXYSMAL POSITIONAL VERTIGO 04/10/2009  . Seasonal and perennial allergic rhinitis 04/13/2008  . TOBACCO USE in remission 02/20/2008  . COUGH 02/20/2008  . Asthma with bronchitis 01/16/2008    Past Surgical History:  Procedure Laterality Date  . dental extractions    . dental  extractions      OB History    No data available       Home Medications    Prior to Admission medications   Medication Sig Start Date End Date Taking? Authorizing Provider  albuterol (PROAIR HFA) 108 (90 Base) MCG/ACT inhaler 2 puffs every 6 hours if needed 05/13/15  Yes Deneise Lever, MD  amphetamine-dextroamphetamine (ADDERALL XR) 25 MG 24 hr capsule Take 25 mg by mouth every morning. 12/28/15  Yes Historical Provider, MD  cetirizine (ZYRTEC) 10 MG tablet Take 10 mg by mouth daily.   Yes Historical Provider, MD  fluticasone (FLONASE) 50 MCG/ACT nasal spray 1-2 puffs each nostril once daily Patient taking differently: Place 2 sprays into both nostrils daily.  05/13/15  Yes Deneise Lever, MD  mometasone-formoterol (DULERA) 100-5 MCG/ACT AERO 2 puffs then rinse mouth, twice daily Patient taking differently: Inhale 2 puffs into the lungs daily. 2 puffs then rinse mouth, twice daily 05/13/15  Yes Deneise Lever, MD  naproxen sodium (ANAPROX) 220 MG tablet Take 440 mg by mouth daily as needed (PAIN).    Yes Historical Provider, MD  ciprofloxacin (CILOXAN) 0.3 % ophthalmic solution Administer 1 drop, every 2 hours, while awake, for 2 days. Then 1 drop, every 4 hours, while awake, for the next 5 days Patient not taking: Reported on 12/29/2015 11/18/15   Burnis Medin, MD  doxycycline (VIBRA-TABS) 100 MG tablet Take 1 tablet (100 mg total) by mouth 2 (two) times daily after a meal. Patient not taking: Reported on 12/29/2015 09/27/15   Brand Males, MD  Family History Family History  Problem Relation Age of Onset  . Asthma Mother   . Breast cancer Mother   . Asthma Sister     smoker  . Asthma Sister     smoker  . Breast cancer Sister   . Ovarian cancer Sister   . ADD / ADHD Son     Social History Social History  Substance Use Topics  . Smoking status: Former Smoker    Packs/day: 0.10    Types: Cigarettes    Quit date: 03/30/2014  . Smokeless tobacco: Never Used     Comment:  has quit "lots of times"  . Alcohol use Yes     Allergies   Demerol [meperidine]; Dilaudid [hydromorphone hcl]; Meloxicam; Penicillins; and Sulfonamide derivatives   Review of Systems Review of Systems  Constitutional: Negative for chills and fever.  HENT: Negative for ear pain and sore throat.   Eyes: Negative for pain and visual disturbance.  Respiratory: Negative for cough and shortness of breath.   Cardiovascular: Negative for chest pain and palpitations.  Gastrointestinal: Positive for abdominal pain and nausea. Negative for anorexia, constipation, diarrhea, flatus, hematochezia, melena and vomiting.  Genitourinary: Negative for dysuria and hematuria.  Musculoskeletal: Negative for arthralgias and back pain.  Skin: Negative for color change and rash.  Neurological: Negative for seizures, syncope and headaches.  All other systems reviewed and are negative.    Physical Exam Updated Vital Signs BP (!) 142/107   Pulse (!) 59   Resp 17   Ht 5\' 8"  (1.727 m)   Wt 180 lb (81.6 kg)   SpO2 99%   BMI 27.37 kg/m   Physical Exam  Constitutional: She is oriented to person, place, and time. She appears well-developed and well-nourished. No distress.  HENT:  Head: Normocephalic and atraumatic.  Nose: Nose normal.  Eyes: Conjunctivae and EOM are normal. Pupils are equal, round, and reactive to light. Right eye exhibits no discharge. Left eye exhibits no discharge. No scleral icterus.  Neck: Normal range of motion. Neck supple.  Cardiovascular: Normal rate and regular rhythm.  Exam reveals no gallop and no friction rub.   No murmur heard. Pulmonary/Chest: Effort normal and breath sounds normal. No stridor. No respiratory distress. She has no rales.  Abdominal: Soft. She exhibits no distension. There is tenderness in the right lower quadrant and suprapubic area. There is rebound, guarding and tenderness at McBurney's point. There is no rigidity, no CVA tenderness and negative Murphy's  sign.  +Rovsing's and obturator  Musculoskeletal: She exhibits no edema or tenderness.  Neurological: She is alert and oriented to person, place, and time.  Skin: Skin is warm and dry. No rash noted. She is not diaphoretic. No erythema.  Psychiatric: She has a normal mood and affect.  Vitals reviewed.    ED Treatments / Results  Labs (all labs ordered are listed, but only abnormal results are displayed) Labs Reviewed  COMPREHENSIVE METABOLIC PANEL - Abnormal; Notable for the following:       Result Value   Potassium 3.2 (*)    CO2 18 (*)    Glucose, Bld 117 (*)    Total Protein 6.4 (*)    All other components within normal limits  LIPASE, BLOOD  CBC  URINALYSIS, ROUTINE W REFLEX MICROSCOPIC (NOT AT [facility])    EKG  EKG Interpretation  Date/Time:  Sunday December 29 2015 21:53:34 EDT Ventricular Rate:  60 PR Interval:    QRS Duration: 101 QT Interval:  468 QTC Calculation: 468  R Axis:   -90 Text Interpretation:  Sinus rhythm Left axis deviation No previous ECGs available Confirmed by [facility] MD, PEDRO [id number]) on 12/29/2015 11:20:26 PM       Radiology No results found.  Procedures Procedures (including critical care time)  Medications Ordered in ED Medications  iopamidol (ISOVUE-300) 61 % injection (not administered)  ondansetron (ZOFRAN) injection 4 mg (4 mg Intravenous Given 12/29/15 2307)  sodium chloride 0.9 % bolus 1,000 mL (1,000 mLs Intravenous New Bag/Given 12/29/15 2306)  fentaNYL (SUBLIMAZE) injection 75 mcg (75 mcg Intravenous Given 12/29/15 2307)     Initial Impression / Assessment and Plan / ED Course  I have reviewed the triage vital signs and the nursing notes.  Pertinent labs & imaging results that were available during my care of the patient were reviewed by me and considered in my medical decision making (see chart for details).  Clinical Course    Sudden onset right lower quadrant abdominal pain that is rapidly worsening since onset. Exam  concerning for local peritonitis. We'll like to rule out ovarian torsion and appendicitis.  She provided with IV fluids and pain medication. Screening labs obtained including urine. CT abdomen and pelvis as well as pelvic ultrasound pending.  Patient care turned over to Dr Dina Rich at 1200. Patient case and results discussed in detail; please see their note for further ED managment.   Final Clinical Impressions(s) / ED Diagnoses   Final diagnoses:  RLQ abdominal pain      Fatima Blank, MD 12/30/15 [phone]

## 2015-12-30 MED ORDER — HYDROCODONE-ACETAMINOPHEN 5-325 MG PO TABS: 1.0000 | ORAL_TABLET | ORAL | 0 refills | Status: DC | PRN

## 2015-12-30 MED ORDER — CEPHALEXIN 500 MG PO CAPS: 500.0000 mg | ORAL_CAPSULE | Freq: Three times a day (TID) | ORAL | 0 refills | Status: DC

## 2015-12-30 MED ORDER — ONDANSETRON 4 MG PO TBDP: 4.0000 mg | ORAL_TABLET | Freq: Three times a day (TID) | ORAL | 0 refills | Status: DC | PRN

## 2015-12-30 MED ORDER — KETOROLAC TROMETHAMINE 15 MG/ML IJ SOLN
15.0000 mg | Freq: Once | INTRAMUSCULAR | Status: AC
  Administered 2015-12-30: 15 mg via INTRAVENOUS
  Filled 2015-12-30: qty 1

## 2015-12-30 NOTE — ED Provider Notes (Signed)
Patient signed out pending imaging.  Imaging notable for 2 mm obstructive stone right UPJ. Otherwise no evidence of torsion or appendicitis. On recheck, patient reports that she feels better. She is able to tolerate fluids. She was given Toradol. I discussed with patient expectant management with pain and nausea medication. Urology follow-up.  Urine specimen appears dirty; however, she does have 6-30 white cells and many bacteria. She is afebrile no signs of infection. We'll place on Keflex for 7 days. Urine culture pending.  Results for orders placed or performed during the hospital encounter of 12/29/15  Lipase, blood  Result Value Ref Range   Lipase 28 11 - 51 U/L  Comprehensive metabolic panel  Result Value Ref Range   Sodium 137 135 - 145 mmol/L   Potassium 3.2 (L) 3.5 - 5.1 mmol/L   Chloride 106 101 - 111 mmol/L   CO2 18 (L) 22 - 32 mmol/L   Glucose, Bld 117 (H) 65 - 99 mg/dL   BUN 10 6 - 20 mg/dL   Creatinine, Ser 0.80 0.44 - 1.00 mg/dL   Calcium 9.0 8.9 - 10.3 mg/dL   Total Protein 6.4 (L) 6.5 - 8.1 g/dL   Albumin 3.7 3.5 - 5.0 g/dL   AST 26 15 - 41 U/L   ALT 25 14 - 54 U/L   Alkaline Phosphatase 59 38 - 126 U/L   Total Bilirubin 0.4 0.3 - 1.2 mg/dL   GFR calc non Af Amer >60 >60 mL/min   GFR calc Af Amer >60 >60 mL/min   Anion gap 13 5 - 15  CBC  Result Value Ref Range   WBC 5.5 4.0 - 10.5 K/uL   RBC 4.35 3.87 - 5.11 MIL/uL   Hemoglobin 12.7 12.0 - 15.0 g/dL   HCT 38.6 36.0 - 46.0 %   MCV 88.7 78.0 - 100.0 fL   MCH 29.2 26.0 - 34.0 pg   MCHC 32.9 30.0 - 36.0 g/dL   RDW 12.5 11.5 - 15.5 %   Platelets 222 150 - 400 K/uL  Urinalysis, Routine w reflex microscopic  Result Value Ref Range   Color, Urine STRAW (A) YELLOW   APPearance CLEAR CLEAR   Specific Gravity, Urine 1.010 1.005 - 1.030   pH 6.5 5.0 - 8.0   Glucose, UA NEGATIVE NEGATIVE mg/dL   Hgb urine dipstick MODERATE (A) NEGATIVE   Bilirubin Urine NEGATIVE NEGATIVE   Ketones, ur NEGATIVE NEGATIVE mg/dL    Protein, ur NEGATIVE NEGATIVE mg/dL   Nitrite NEGATIVE NEGATIVE   Leukocytes, UA LARGE (A) NEGATIVE  Urine microscopic-add on  Result Value Ref Range   Squamous Epithelial / LPF 6-30 (A) NONE SEEN   WBC, UA 6-30 0 - 5 WBC/hpf   RBC / HPF 0-5 0 - 5 RBC/hpf   Bacteria, UA MANY (A) NONE SEEN   US Transvaginal Non-ob  Result Date: 12/30/2015 CLINICAL DATA:  Acute onset of right lower quadrant abdominal pain. Initial encounter. EXAM: TRANSABDOMINAL AND TRANSVAGINAL ULTRASOUND OF PELVIS TECHNIQUE: Both transabdominal and transvaginal ultrasound examinations of the pelvis were performed. Transabdominal technique was performed for global imaging of the pelvis including uterus, ovaries, adnexal regions, and pelvic cul-de-sac. It was necessary to proceed with endovaginal exam following the transabdominal exam to visualize the ovaries. However, the ovaries were not successfully visualized by endovaginal approach. Thus, Doppler evaluation of the ovaries was not performed. COMPARISON:  CT of the abdomen and pelvis performed 03/25/2014 FINDINGS: Uterus Measurements: 6.1 x 2.9 x 4.7 cm. No fibroids or other  mass visualized. Endometrium Thickness: 0.4 cm, within normal limits in caliber. No focal abnormality visualized. Right ovary Not visualized. Left ovary Not visualized. Other findings No abnormal free fluid. IMPRESSION: Unremarkable pelvic ultrasound. The ovaries are not visualized on this study. The uterus is grossly unremarkable in appearance. Electronically Signed   By: Garald Balding M.D.   On: 12/30/2015 00:57   US Pelvis Complete  Result Date: 12/30/2015 CLINICAL DATA:  Acute onset of right lower quadrant abdominal pain. Initial encounter. EXAM: TRANSABDOMINAL AND TRANSVAGINAL ULTRASOUND OF PELVIS TECHNIQUE: Both transabdominal and transvaginal ultrasound examinations of the pelvis were performed. Transabdominal technique was performed for global imaging of the pelvis including uterus, ovaries, adnexal  regions, and pelvic cul-de-sac. It was necessary to proceed with endovaginal exam following the transabdominal exam to visualize the ovaries. However, the ovaries were not successfully visualized by endovaginal approach. Thus, Doppler evaluation of the ovaries was not performed. COMPARISON:  CT of the abdomen and pelvis performed 03/25/2014 FINDINGS: Uterus Measurements: 6.1 x 2.9 x 4.7 cm. No fibroids or other mass visualized. Endometrium Thickness: 0.4 cm, within normal limits in caliber. No focal abnormality visualized. Right ovary Not visualized. Left ovary Not visualized. Other findings No abnormal free fluid. IMPRESSION: Unremarkable pelvic ultrasound. The ovaries are not visualized on this study. The uterus is grossly unremarkable in appearance. Electronically Signed   By: Garald Balding M.D.   On: 12/30/2015 00:57   Ct Abdomen Pelvis W Contrast  Result Date: 12/30/2015 CLINICAL DATA:  RIGHT lower quadrant pain beginning tonight at 2030 hours. History of diverticulitis. EXAM: CT ABDOMEN AND PELVIS WITH CONTRAST TECHNIQUE: Multidetector CT imaging of the abdomen and pelvis was performed using the standard protocol following bolus administration of intravenous contrast. CONTRAST:  128mL ISOVUE-300 IOPAMIDOL (ISOVUE-300) INJECTION 61% COMPARISON:  CT abdomen and pelvis March 25, 2014 FINDINGS: LOWER CHEST: Lung bases are clear. Included heart size is normal. No pericardial effusion. HEPATOBILIARY: 2 stable sub cm liver cyst, the liver is otherwise unremarkable. Normal gallbladder. PANCREAS: Normal. SPLEEN: Normal. ADRENALS/URINARY TRACT: Kidneys are orthotopic, demonstrating symmetric enhancement. Mild RIGHT hydroureteronephrosis to the ureterovesicular junction re- punctate calculus is present. 2 mm RIGHT lower pole nephrolithiasis. Solid renal masses. Delayed imaging through the kidneys demonstrates symmetric prompt contrast excretion within the proximal urinary collecting system. Urinary bladder is  partially distended and unremarkable. Normal adrenal glands. STOMACH/BOWEL: Small hiatal hernia. The stomach, small and large bowel are normal in course and caliber without inflammatory changes. Moderate sigmoid diverticulosis. Normal appendix. VASCULAR/LYMPHATIC: Aortoiliac vessels are normal in course and caliber. No lymphadenopathy by CT size criteria. REPRODUCTIVE: Normal. OTHER: No intraperitoneal free fluid or free air. MUSCULOSKELETAL: Nonacute. Stable small probable varix RIGHT anterior abdominal wall. Minimal anterior abdominal wall scarring. IMPRESSION: Punctate RIGHT ureterovesicular junction calculus resulting in minimal hydroureteronephrosis. Residual 2 mm RIGHT lower pole nephrolithiasis. Moderate sigmoid diverticulosis without diverticulitis. Normal appendix. Electronically Signed   By: Elon Alas M.D.   On: 12/30/2015 00:56      Merryl Hacker, MD 12/30/15 [phone]

## 2015-12-30 NOTE — Discharge Instructions (Signed)
You have a 2 mm kidney stone which should pass on its own.

## 2015-12-31 LAB — URINE CULTURE

## 2016-01-07 ENCOUNTER — Other Ambulatory Visit: Payer: Self-pay | Admitting: Internal Medicine

## 2016-02-12 ENCOUNTER — Encounter: Payer: Self-pay | Admitting: Internal Medicine

## 2016-02-12 ENCOUNTER — Ambulatory Visit (INDEPENDENT_AMBULATORY_CARE_PROVIDER_SITE_OTHER): Payer: Commercial Managed Care - HMO | Admitting: Internal Medicine

## 2016-02-12 ENCOUNTER — Telehealth: Payer: Self-pay | Admitting: Internal Medicine

## 2016-02-12 VITALS — BP 158/86 | Temp 98.5°F | Ht 68.0 in

## 2016-02-12 DIAGNOSIS — H01139 Eczematous dermatitis of unspecified eye, unspecified eyelid: Secondary | ICD-10-CM | POA: Diagnosis not present

## 2016-02-12 DIAGNOSIS — L282 Other prurigo: Secondary | ICD-10-CM | POA: Diagnosis not present

## 2016-02-12 DIAGNOSIS — L309 Dermatitis, unspecified: Secondary | ICD-10-CM | POA: Diagnosis not present

## 2016-02-12 MED ORDER — TRIAMCINOLONE ACETONIDE 0.1 % EX CREA: 1.0000 "application " | TOPICAL_CREAM | Freq: Two times a day (BID) | CUTANEOUS | 1 refills | Status: DC

## 2016-02-12 NOTE — Progress Notes (Signed)
Pre visit review using our clinic review tool, if applicable. No additional management support is needed unless otherwise documented below in the visit note.  Chief Complaint  Patient presents with  . Rash  . Red, Itchy and Puffy eyes    HPI: Elizabeth Pratt 55 y.o.  sda  Seen august for conjunctivitis    And hx of contact use .   Now most recently she is having itchy rashes irritation around her eyelids and wakes up puppy and swollen in the morning but her eyeballs actually fine used all of the Cipro on the last prescription. No new makeup topicals on the skin she's had itching in the antecubital fossa with a rash and then in the axillary area. She hardly ever uses deodorant but did try some she has used hydrocortisone [place] Monistat miconazole 1% hydrocortisone in these areas and still has a problem. She is taking her regular antihistamines as she does have allergies. No new detergents does use bounce sheets that she's been doing it for years. Not sure what could be causing that she has stopped her exposure to her daughter for a few days and still didn't make any difference. ROS: See pertinent positives and negatives per HPI.  Past Medical History:  Diagnosis Date  . Asthma dx age 19  . Diverticulitis   . Diverticulosis   . Migraines    poss atypical ? MRI  . Obesity (BMI 30-39.9) 03/26/2014  . Pneumonia   . Vertigo     Family History  Problem Relation Age of Onset  . Asthma Mother   . Breast cancer Mother   . Asthma Sister     smoker  . Asthma Sister     smoker  . Breast cancer Sister   . Ovarian cancer Sister   . ADD / ADHD Son     Social History   Social History  . Marital status: Single    Spouse name: N/A  . Number of children: N/A  . Years of education: N/A   Occupational History  . 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 Smoking status: Former Smoker    Packs/day: 0.10    Types: Cigarettes    Quit date: 03/30/2014  .  Smokeless tobacco: Never Used     Comment: has quit "lots of times"  . Alcohol use Yes  . Drug use: No  . Sexual activity: Not Asked   Other Topics Concern  . 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   dog    Outpatient Medications Prior to Visit  Medication Sig Dispense Refill  . albuterol (PROAIR HFA) 108 (90 Base) MCG/ACT inhaler 2 puffs every 6 hours if needed 54 each 3  . amphetamine-dextroamphetamine (ADDERALL XR) 25 MG 24 hr capsule Take 25 mg by mouth every morning.    . cetirizine (ZYRTEC) 10 MG tablet Take 10 mg by mouth daily.    . DULERA 100-5 MCG/ACT AERO INHALE 2 PUFFS TWICE DAILY THEN RINSE MOUTH 39 Inhaler 1  . fluticasone (FLONASE) 50 MCG/ACT nasal spray 1-2 puffs each nostril once daily (Patient taking differently: Place 2 sprays into both nostrils daily. ) 48 g 3  . cephALEXin (KEFLEX) 500 MG capsule Take 1 capsule (500 mg total) by mouth 3 (three) times daily. 21 capsule 0  . ciprofloxacin (CILOXAN) 0.3 % ophthalmic solution Administer 1 drop, every 2 hours, while awake, for 2  days. Then 1 drop, every 4 hours, while awake, for the next 5 days (Patient not taking: Reported on 12/29/2015) 5 mL 0  . doxycycline (VIBRA-TABS) 100 MG tablet Take 1 tablet (100 mg total) by mouth 2 (two) times daily after a meal. (Patient not taking: Reported on 12/29/2015) 14 tablet 0  . HYDROcodone-acetaminophen (NORCO/VICODIN) 5-325 MG tablet Take 1 tablet by mouth every 4 (four) hours as needed. 10 tablet 0  . naproxen sodium (ANAPROX) 220 MG tablet Take 440 mg by mouth daily as needed (PAIN).     [name] ondansetron (ZOFRAN ODT) 4 MG disintegrating tablet Take 1 tablet (4 mg total) by mouth every 8 (eight) hours as needed for nausea or vomiting. 20 tablet 0   No facility-administered medications prior to visit.      EXAM:  BP (!) 158/86 (BP Location: Right Arm, Patient Position: Sitting, Cuff Size: Large)   Temp 98.5 F (36.9 C) (Oral)   Ht  5\' 8"  (1.727 m)   There is no height or weight on file to calculate BMI.  GENERAL: vitals reviewed and listed above, alert, oriented, appears well hydrated and in no acute distressShe is scratching. HEENT: atraumatic, conjunctiva  clear, no obvious abnormalities on inspection of external nose and earsHas some mild puffiness around the eyes but irritated rash that isn't red no discharge and the conjunctiva are clear she has her contacts in. OP : no lesion edema or exudate  NECK: no obvious masses on inspection palpation  MS: moves all extremities without noticeable focal  Abnormality Skin a mild antecubital rash blotchy red and very mild in the axillary area without pustules discharge or satellite lesions. Anterior chest mildly red and irritated. Face no redness. Eyes as above no discharge. OP clear with no edema. Trunk otherwise appears clear. Scalp also has no right. PSYCH: pleasant and cooperative, no obvious depression or anxiety  ASSESSMENT AND PLAN:  Discussed the following assessment and plan:  Pruritic rash  Eczematous dermatitis of eyelid, unspecified laterality?  Eczema, unspecified type  Uncertain cause of current situation. Does not appear to be really conjunctivitis with skin related and I don't see a bacterial blepharitis. Appears more allergic with severe itching. Discussed possible etiologies sometimes not found treat with stronger topical cortisone with great care on the face hesitant to tell her she can use it but wants to try it. Consider seeing dermatology. Continue moisturizers. -Patient advised to return or notify health care team  if symptoms worsen ,persist or new concerns arise.  Patient Instructions  Acts like an atypical  Eczema   Or allergic  Dermatitis     Consider seeing   Dermatologist    If not helping   .  Moisturize also  Using the stronger steroid   But  Minimal on the eyes not in the eyes .      Standley Brooking. Elizabeth Pratt M.D.

## 2016-02-12 NOTE — Telephone Encounter (Signed)
Pt decided to make appointment

## 2016-02-12 NOTE — Patient Instructions (Addendum)
Acts like an atypical  Eczema   Or allergic  Dermatitis     Consider seeing   Dermatologist    If not helping   .  Moisturize also  Using the stronger steroid   But  Minimal on the eyes not in the eyes .

## 2016-02-12 NOTE — Telephone Encounter (Signed)
Noted  

## 2016-03-19 ENCOUNTER — Encounter: Payer: Self-pay | Admitting: Internal Medicine

## 2016-03-19 ENCOUNTER — Ambulatory Visit (INDEPENDENT_AMBULATORY_CARE_PROVIDER_SITE_OTHER): Payer: Commercial Managed Care - HMO | Admitting: Internal Medicine

## 2016-03-19 DIAGNOSIS — J45909 Unspecified asthma, uncomplicated: Secondary | ICD-10-CM

## 2016-03-19 DIAGNOSIS — J302 Other seasonal allergic rhinitis: Secondary | ICD-10-CM | POA: Diagnosis not present

## 2016-03-19 DIAGNOSIS — J3089 Other allergic rhinitis: Secondary | ICD-10-CM | POA: Diagnosis not present

## 2016-03-19 MED ORDER — FLUTICASONE FUROATE-VILANTEROL 100-25 MCG/INH IN AEPB: 1.0000 | INHALATION_SPRAY | Freq: Every day | RESPIRATORY_TRACT | 0 refills | Status: DC

## 2016-03-19 MED ORDER — FLUTICASONE FUROATE-VILANTEROL 100-25 MCG/INH IN AEPB: INHALATION_SPRAY | RESPIRATORY_TRACT | 3 refills | Status: DC

## 2016-03-19 NOTE — Progress Notes (Signed)
Patient ID: Elizabeth Pratt, female    DOB: 1961/02/02, 55 y.o.   MRN: [id number]  HPI Female former smoker followed for asthma, bronchitis, eustachian dys.function PFT 04/30/08- FEV1 2.79/99%, FEV1/FVC 0.75, significant response to bronchodilator, TLC 94%, DLCO 109%.  ------------------------------------------------------------------------------   05/13/2015-55 year old female former smoker followed for asthma, bronchitis Vertigo x 1 wk.  No cough, PND, or SOB.   Remains off cigarettes and recognizes significant improvement in cough and chronic bronchitis. Associates mild vertigo with some head congestion. No nausea or headache. Hearing okay. Chronic tinnitus.  03/25/2016-55 year old female former smoker followed for asthma, bronchitis, eustachian dysfunction Acute sinusitis and asthma in June. FOLLOWS FOR: Pt states she is doing well overall; was seen last for sinusitis by MR 09-27-15. Pt has some concerns with medications and which ones her insurance will cover Jan 2017. Dulera to [employer], Advair, or Symbicort.  States she has not smoked in 2 years-congratulated. Breathing remains quite good with no recent wheeze. Occasionally breathing feels "heavy" in allergy season. Insurance requires medication change but she questions if she needs a maintenance controller anymore. Has been using Dulera 2 puffs 1 time daily.  ROS-see HPI Constitutional:   No-   weight loss, night sweats, fevers, chills, fatigue, lassitude. HEENT:   No-  headaches, difficulty swallowing, tooth/dental problems, sore throat,       No-  sneezing, itching, ear ache,   nasal congestion, post nasal drip,  CV:  No-   chest pain, orthopnea, PND, swelling in lower extremities, anasarca, dizziness, palpitations Resp: +shortness of breath with  + exertion not at rest.          No-productive cough,  No non-productive cough,  No- coughing up of blood.              No-   change in color of mucus.  No-wheezing.   Skin: No-   rash or  lesions. GI:  No-   heartburn, indigestion, abdominal pain, nausea, vomiting,  GU: , MS:  No-   joint pain or swelling.  . Neuro-     nothing unusual Psych:  No- change in mood or affect. No depression or anxiety.  No memory loss.  Objective:  OBJ- Physical Exam- unremarkable General- Alert, Oriented, Affect-appropriate, Distress- none acute, +overweight Skin- rash-none, lesions- none, excoriation- none Lymphadenopathy- none Head- atraumatic            Eyes- Gross vision intact, PERRLA, conjunctivae and secretions clear, + mild horizontal nystagmus            Ears- Hearing, canals-normal,             Nose-  no-Septal dev, mucus+, no-polyps, erosion, perforation             Throat- Mallampati II , mucosa clear , drainage- none, tonsils- atrophic Neck- flexible , trachea midline, no stridor , thyroid nl, carotid no bruit Chest - symmetrical excursion , unlabored           Heart/CV- RRR , no murmur , no gallop  , no rub, nl s1 s2                           - JVD- none , edema- none, stasis changes- none, varices- none           Lung- clear, cough-none , dullness-none, rub- none           Chest wall-  Abd- Br/ Gen/ Rectal- Not done, not indicated Extrem- cyanosis-  none, clubbing, none, atrophy- none, strength- nl Neuro- grossly intact to observation

## 2016-03-19 NOTE — Patient Instructions (Addendum)
Sample and print script for Breo 10 Ellipta     Inhale 1 puff, then rinse mouth, once daily   Try this as a maintenance controller instead of Dulera. If you like it ok, fill the script. Otherwise call us.   Please call as needed

## 2016-03-31 ENCOUNTER — Telehealth: Payer: Self-pay | Admitting: Internal Medicine

## 2016-03-31 NOTE — Telephone Encounter (Signed)
error 

## 2016-08-16 NOTE — Assessment & Plan Note (Signed)
She still needs antihistamines but without the tobacco irritant component of her rhinitis she is managing better

## 2016-08-16 NOTE — Assessment & Plan Note (Addendum)
I suggested we try switching to Breo instead of [place] for insurance coverage. Consider when to update PFT.  Pt was given sample of Breo 185mcg/25mcg inhaler; pt was taught how to use and demonstrated use back to nurse. Elizabeth Pratt

## 2016-08-17 DIAGNOSIS — M25532 Pain in left wrist: Secondary | ICD-10-CM | POA: Diagnosis not present

## 2016-08-17 DIAGNOSIS — M25832 Other specified joint disorders, left wrist: Secondary | ICD-10-CM | POA: Diagnosis not present

## 2016-08-18 DIAGNOSIS — M25832 Other specified joint disorders, left wrist: Secondary | ICD-10-CM | POA: Diagnosis not present

## 2016-11-24 ENCOUNTER — Other Ambulatory Visit: Payer: Self-pay | Admitting: Internal Medicine

## 2016-11-24 NOTE — Telephone Encounter (Signed)
Please advise 

## 2016-11-24 NOTE — Telephone Encounter (Signed)
Need more information   Of need for refill . Dose she have a problem now  Is she wearing   contacts??

## 2016-11-24 NOTE — Telephone Encounter (Signed)
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 Pt states the refill she had on this expired.   [facility]  [address] [phone] [facility] [address]

## 2016-11-25 ENCOUNTER — Telehealth: Payer: Self-pay | Admitting: Emergency Medicine

## 2016-11-25 MED ORDER — TRIAMCINOLONE ACETONIDE 0.1 % EX CREA: 1.0000 "application " | TOPICAL_CREAM | Freq: Two times a day (BID) | CUTANEOUS | 1 refills | Status: DC

## 2016-11-25 MED ORDER — CIPROFLOXACIN HCL 0.3 % OP SOLN: OPHTHALMIC | 1 refills | Status: DC

## 2016-11-25 NOTE — Telephone Encounter (Signed)
Spoke with pt and she states that she woke up this past Friday with some puffiness and what seem to be fluid-filled sacs under her eyes, bilaterally. The puffiness has gotten worse and is spreading "up" her eyes. The skin around her eyes is red. She reports some mild matting when waking in the mornings, but denies any discharge otherwise. She does have some mild photophobia but states this is normal for her. She denies any redness of the eyeball, fever or other symptoms. She wears daily contact lenses and is aware of the restrictions while using the eye drops. She would like to have the eye drops with refills since this seems to happen at the same time each year. She is also asking for refills on the triamcinolone for her eczema.   Dr. Regis Bill - Please advise. Thanks!

## 2016-11-25 NOTE — Telephone Encounter (Signed)
Please advise patient called back

## 2016-11-25 NOTE — Telephone Encounter (Signed)
Spoke with patient and she states that around her eyes are inflamed and red. Patient is not wearing her contacts. So far no leakage from eyes. Patient would like the Cipro drops that she was prescribed last year.

## 2016-11-25 NOTE — Telephone Encounter (Signed)
See other TE dated 11/24/16

## 2016-11-25 NOTE — Telephone Encounter (Signed)
Per Dr. Regis Bill she approves refills for both medications. LM on pt's vm advising and also to call office if not improving. Rx's sent to pharmacy.

## 2016-11-25 NOTE — Telephone Encounter (Signed)
Pt stated to me that this is the same type infection she had a year ago, and the cipro drops is the only thing that helped.  Pt has "sacs" under her eyes filled with fluid.  If pt needs to make an appt she will, but hopes she doesn't have to.

## 2017-01-13 DIAGNOSIS — M79645 Pain in left finger(s): Secondary | ICD-10-CM | POA: Diagnosis not present

## 2017-01-13 DIAGNOSIS — M25832 Other specified joint disorders, left wrist: Secondary | ICD-10-CM | POA: Diagnosis not present

## 2017-01-13 DIAGNOSIS — M25532 Pain in left wrist: Secondary | ICD-10-CM | POA: Diagnosis not present

## 2017-01-19 DIAGNOSIS — M25832 Other specified joint disorders, left wrist: Secondary | ICD-10-CM | POA: Diagnosis not present

## 2017-02-09 DIAGNOSIS — M24832 Other specific joint derangements of left wrist, not elsewhere classified: Secondary | ICD-10-CM | POA: Diagnosis not present

## 2017-02-09 DIAGNOSIS — M25532 Pain in left wrist: Secondary | ICD-10-CM | POA: Diagnosis not present

## 2017-02-09 DIAGNOSIS — M659 Synovitis and tenosynovitis, unspecified: Secondary | ICD-10-CM | POA: Diagnosis not present

## 2017-02-09 DIAGNOSIS — M24132 Other articular cartilage disorders, left wrist: Secondary | ICD-10-CM | POA: Diagnosis not present

## 2017-02-09 DIAGNOSIS — M21832 Other specified acquired deformities of left forearm: Secondary | ICD-10-CM | POA: Diagnosis not present

## 2017-02-09 DIAGNOSIS — G8918 Other acute postprocedural pain: Secondary | ICD-10-CM | POA: Diagnosis not present

## 2017-02-23 DIAGNOSIS — M24832 Other specific joint derangements of left wrist, not elsewhere classified: Secondary | ICD-10-CM | POA: Diagnosis not present

## 2017-03-08 DIAGNOSIS — M25532 Pain in left wrist: Secondary | ICD-10-CM | POA: Diagnosis not present

## 2017-03-08 DIAGNOSIS — M24832 Other specific joint derangements of left wrist, not elsewhere classified: Secondary | ICD-10-CM | POA: Diagnosis not present

## 2017-03-17 DIAGNOSIS — M25532 Pain in left wrist: Secondary | ICD-10-CM | POA: Diagnosis not present

## 2017-03-22 DIAGNOSIS — M25532 Pain in left wrist: Secondary | ICD-10-CM | POA: Diagnosis not present

## 2017-03-31 DIAGNOSIS — M25532 Pain in left wrist: Secondary | ICD-10-CM | POA: Diagnosis not present

## 2017-04-02 ENCOUNTER — Telehealth: Payer: Self-pay | Admitting: Internal Medicine

## 2017-04-02 MED ORDER — ALBUTEROL SULFATE HFA 108 (90 BASE) MCG/ACT IN AERS: INHALATION_SPRAY | RESPIRATORY_TRACT | 0 refills | Status: DC

## 2017-04-02 NOTE — Telephone Encounter (Signed)
Spoke with patient-aware that Rx has been [facility] [place] and nothing more needed at this time.

## 2017-04-08 DIAGNOSIS — M25532 Pain in left wrist: Secondary | ICD-10-CM | POA: Diagnosis not present

## 2017-04-12 DIAGNOSIS — M25532 Pain in left wrist: Secondary | ICD-10-CM | POA: Diagnosis not present

## 2017-04-12 DIAGNOSIS — Z4789 Encounter for other orthopedic aftercare: Secondary | ICD-10-CM | POA: Diagnosis not present

## 2017-04-14 DIAGNOSIS — M25532 Pain in left wrist: Secondary | ICD-10-CM | POA: Diagnosis not present

## 2017-04-16 DIAGNOSIS — M24832 Other specific joint derangements of left wrist, not elsewhere classified: Secondary | ICD-10-CM | POA: Diagnosis not present

## 2017-04-16 DIAGNOSIS — M25532 Pain in left wrist: Secondary | ICD-10-CM | POA: Diagnosis not present

## 2017-04-21 DIAGNOSIS — M25532 Pain in left wrist: Secondary | ICD-10-CM | POA: Diagnosis not present

## 2017-04-28 DIAGNOSIS — M25532 Pain in left wrist: Secondary | ICD-10-CM | POA: Diagnosis not present

## 2017-05-05 DIAGNOSIS — M25532 Pain in left wrist: Secondary | ICD-10-CM | POA: Diagnosis not present

## 2017-05-10 DIAGNOSIS — M24832 Other specific joint derangements of left wrist, not elsewhere classified: Secondary | ICD-10-CM | POA: Diagnosis not present

## 2017-05-10 DIAGNOSIS — Z4789 Encounter for other orthopedic aftercare: Secondary | ICD-10-CM | POA: Diagnosis not present

## 2017-05-12 DIAGNOSIS — M25532 Pain in left wrist: Secondary | ICD-10-CM | POA: Diagnosis not present

## 2017-05-24 DIAGNOSIS — M25532 Pain in left wrist: Secondary | ICD-10-CM | POA: Diagnosis not present

## 2017-06-09 DIAGNOSIS — M25532 Pain in left wrist: Secondary | ICD-10-CM | POA: Diagnosis not present

## 2017-06-17 DIAGNOSIS — M25532 Pain in left wrist: Secondary | ICD-10-CM | POA: Diagnosis not present

## 2017-06-21 DIAGNOSIS — M24832 Other specific joint derangements of left wrist, not elsewhere classified: Secondary | ICD-10-CM | POA: Diagnosis not present

## 2017-06-21 DIAGNOSIS — M25532 Pain in left wrist: Secondary | ICD-10-CM | POA: Diagnosis not present

## 2017-06-21 DIAGNOSIS — G5602 Carpal tunnel syndrome, left upper limb: Secondary | ICD-10-CM | POA: Diagnosis not present

## 2017-08-26 DIAGNOSIS — L821 Other seborrheic keratosis: Secondary | ICD-10-CM | POA: Diagnosis not present

## 2017-08-26 DIAGNOSIS — C44319 Basal cell carcinoma of skin of other parts of face: Secondary | ICD-10-CM | POA: Diagnosis not present

## 2017-08-26 DIAGNOSIS — D485 Neoplasm of uncertain behavior of skin: Secondary | ICD-10-CM | POA: Diagnosis not present

## 2017-12-24 ENCOUNTER — Telehealth: Payer: Self-pay | Admitting: Internal Medicine

## 2017-12-24 MED ORDER — ALBUTEROL SULFATE HFA 108 (90 BASE) MCG/ACT IN AERS: INHALATION_SPRAY | RESPIRATORY_TRACT | 0 refills | Status: DC

## 2017-12-24 NOTE — Telephone Encounter (Signed)
Called patient and verified pharmacy script sent in.

## 2017-12-25 ENCOUNTER — Telehealth: Payer: Self-pay | Admitting: Pulmonary Disease

## 2017-12-25 MED ORDER — ALBUTEROL SULFATE HFA 108 (90 BASE) MCG/ACT IN AERS: 2.0000 | INHALATION_SPRAY | Freq: Four times a day (QID) | RESPIRATORY_TRACT | 6 refills | Status: DC | PRN

## 2017-12-25 NOTE — Telephone Encounter (Signed)
Called in prescription for albuterol

## 2017-12-25 NOTE — Telephone Encounter (Signed)
Patient called for inhaler  [facility]ol [facility] to [address] [facility]

## 2017-12-27 ENCOUNTER — Telehealth: Payer: Self-pay | Admitting: Internal Medicine

## 2017-12-27 NOTE — Telephone Encounter (Signed)
Called and spoke with patient this has already been taken care of nothing further needed at this time.

## 2017-12-29 DIAGNOSIS — C4401 Basal cell carcinoma of skin of lip: Secondary | ICD-10-CM | POA: Diagnosis not present

## 2017-12-31 DIAGNOSIS — L309 Dermatitis, unspecified: Secondary | ICD-10-CM | POA: Diagnosis not present

## 2018-02-02 ENCOUNTER — Ambulatory Visit (INDEPENDENT_AMBULATORY_CARE_PROVIDER_SITE_OTHER)
Admission: RE | Admit: 2018-02-02 | Discharge: 2018-02-02 | Disposition: A | Discharge status: Home / Self Care | Payer: 59 | Source: Ambulatory Visit | Attending: Internal Medicine | Admitting: Internal Medicine

## 2018-02-02 ENCOUNTER — Encounter: Payer: Self-pay | Admitting: Internal Medicine

## 2018-02-02 ENCOUNTER — Ambulatory Visit (INDEPENDENT_AMBULATORY_CARE_PROVIDER_SITE_OTHER): Payer: 59 | Admitting: Internal Medicine

## 2018-02-02 VITALS — BP 142/90 | HR 77 | Ht 66.0 in | Wt 180.0 lb

## 2018-02-02 DIAGNOSIS — J45909 Unspecified asthma, uncomplicated: Secondary | ICD-10-CM

## 2018-02-02 DIAGNOSIS — F17200 Nicotine dependence, unspecified, uncomplicated: Secondary | ICD-10-CM | POA: Diagnosis not present

## 2018-02-02 DIAGNOSIS — R05 Cough: Secondary | ICD-10-CM | POA: Diagnosis not present

## 2018-02-02 MED ORDER — FLUTICASONE FUROATE-VILANTEROL 100-25 MCG/INH IN AEPB: INHALATION_SPRAY | RESPIRATORY_TRACT | 3 refills | Status: DC

## 2018-02-02 MED ORDER — FLUTICASONE FUROATE-VILANTEROL 100-25 MCG/INH IN AEPB: 1.0000 | INHALATION_SPRAY | Freq: Every day | RESPIRATORY_TRACT | 1 refills | Status: DC

## 2018-02-02 MED ORDER — ALBUTEROL SULFATE HFA 108 (90 BASE) MCG/ACT IN AERS: 2.0000 | INHALATION_SPRAY | Freq: Four times a day (QID) | RESPIRATORY_TRACT | 3 refills | Status: DC | PRN

## 2018-02-02 MED ORDER — METHYLPREDNISOLONE ACETATE 80 MG/ML IJ SUSP
80.0000 mg | Freq: Once | INTRAMUSCULAR | Status: AC
  Administered 2018-02-02: 80 mg via INTRAMUSCULAR

## 2018-02-02 MED ORDER — LEVALBUTEROL HCL 0.63 MG/3ML IN NEBU
0.6300 mg | INHALATION_SOLUTION | RESPIRATORY_TRACT | Status: AC
  Administered 2018-02-02: 0.63 mg via RESPIRATORY_TRACT

## 2018-02-02 MED ORDER — FLUTICASONE PROPIONATE 50 MCG/ACT NA SUSP: NASAL | 3 refills | Status: DC

## 2018-02-02 NOTE — Patient Instructions (Addendum)
Order- neb xop 0.63    Dx    Asthmatic bronchitis exacerbation             Depo 80  Sample Breo 100    Inhale 1 puff then rinse mouth, once daily  Order- CXR   Dx asthmatic bronchitis exacerbation

## 2018-02-02 NOTE — Progress Notes (Signed)
Patient ID: Elizabeth Pratt, female    DOB: February 03, 1961, 56 y.o.   MRN: [id number]  HPI Female former smoker followed for asthma, bronchitis, eustachian dys.function PFT 04/30/08- FEV1 2.79/99%, FEV1/FVC 0.75, significant response to bronchodilator, TLC 94%, DLCO 109%.  ----------------------------------------------------------------------------- 03/25/2016-57 year old female former smoker followed for asthma, bronchitis, eustachian dysfunction Acute sinusitis and asthma in June. FOLLOWS FOR: Pt states she is doing well overall; was seen last for sinusitis by MR 09-27-15. Pt has some concerns with medications and which ones her insurance will cover Jan 2017. Dulera to [employer], Advair, or Symbicort.  States she has not smoked in 2 years-congratulated. Breathing remains quite good with no recent wheeze. Occasionally breathing feels "heavy" in allergy season. Insurance requires medication change but she questions if she needs a maintenance controller anymore. Has been using Dulera 2 puffs 1 time daily.  02/02/2018- 57 year old female former smoker followed for asthma, bronchitis, eustachian dysfunction -----Follows for: asthma, bronchitis, allergic reaction to perfume which started a flare up, coughed more when she traveled to [facility], mucus was clear at first but now it is thick/green mucus, has not been using the [place], felt she didnt need it albuterol HFA, (Dulera 100/Breo 100) Says she had done well this year, traveling in the [place].  Has not needed maintenance inhaler much at all.  Distantly was exposed to strong cologne in a hotel lobby and had a lot of coughing, then bothered by weather changes and temperature changes going in and out of air conditioning.  Mucus had been clear until today when she began having increased sinus drainage.  We discussed use of Sudafed for nasal congestion. Has not smoked in 4 years.  ROS-see HPI   + = positive Constitutional:   No-   weight loss, night sweats, fevers,  chills, fatigue, lassitude. HEENT:   No-  headaches, difficulty swallowing, tooth/dental problems, sore throat,       No-  sneezing, itching, ear ache, +  nasal congestion,+ post nasal drip,  CV:  No-   chest pain, orthopnea, PND, swelling in lower extremities, anasarca, dizziness, palpitations Resp: +shortness of breath with  + exertion not at rest.          No-productive cough,  + non-productive cough,  No- coughing up of blood.              No-   change in color of mucus.  No-wheezing.   Skin: No-   rash or lesions. GI:  No-   heartburn, indigestion, abdominal pain, nausea, vomiting,  GU: , MS:  No-   joint pain or swelling.  . Neuro-     nothing unusual Psych:  No- change in mood or affect. No depression or anxiety.  No memory loss.  Objective:  OBJ- Physical Exam- unremarkable General- Alert, Oriented, Affect-appropriate, Distress- none acute, +overweight Skin- rash-none, lesions- none, excoriation- none Lymphadenopathy- none Head- atraumatic            Eyes- Gross vision intact, PERRLA, conjunctivae and secretions clear, + mild horizontal nystagmus            Ears- Hearing, canals-normal,             Nose-  no-Septal dev, mucus+, no-polyps, erosion, perforation             Throat- Mallampati II , mucosa clear , drainage- none, tonsils- atrophic Neck- flexible , trachea midline, no stridor , thyroid nl, carotid no bruit Chest - symmetrical excursion , unlabored  Heart/CV- RRR , no murmur , no gallop  , no rub, nl s1 s2                           - JVD- none , edema- none, stasis changes- none, varices- none           Lung- clear, cough-none , dullness-none, rub- none           Chest wall-  Abd- Br/ Gen/ Rectal- Not done, not indicated Extrem- cyanosis- none, clubbing, none, atrophy- none, strength- nl Neuro- grossly intact to observation

## 2018-02-03 ENCOUNTER — Telehealth: Payer: Self-pay | Admitting: Internal Medicine

## 2018-02-03 NOTE — Telephone Encounter (Signed)
She wanted clarification about the term COPD applied to her recent CXR interpretation. We discussed this. Sh agrees to schedule PFT  Order- Schedule PFT to be done in 2-3 weeks   Dx asthmatic bronchitis

## 2018-02-10 ENCOUNTER — Ambulatory Visit: Payer: Commercial Managed Care - HMO | Admitting: Internal Medicine

## 2018-02-25 ENCOUNTER — Encounter: Payer: Self-pay | Admitting: Emergency Medicine

## 2018-02-25 DIAGNOSIS — F902 Attention-deficit hyperactivity disorder, combined type: Secondary | ICD-10-CM

## 2018-02-25 DIAGNOSIS — F411 Generalized anxiety disorder: Secondary | ICD-10-CM

## 2018-03-14 ENCOUNTER — Encounter: Payer: Self-pay | Admitting: Psychiatry

## 2018-03-14 ENCOUNTER — Ambulatory Visit: Payer: 59 | Admitting: Psychiatry

## 2018-03-14 VITALS — BP 142/94 | HR 72 | Ht 67.5 in | Wt 180.0 lb

## 2018-03-14 DIAGNOSIS — F411 Generalized anxiety disorder: Secondary | ICD-10-CM | POA: Diagnosis not present

## 2018-03-14 DIAGNOSIS — F902 Attention-deficit hyperactivity disorder, combined type: Secondary | ICD-10-CM

## 2018-03-14 MED ORDER — AMPHETAMINE-DEXTROAMPHET ER 20 MG PO CP24: 20.0000 mg | ORAL_CAPSULE | Freq: Every day | ORAL | 0 refills | Status: DC

## 2018-03-14 MED ORDER — AMPHETAMINE-DEXTROAMPHET ER 25 MG PO CP24: 25.0000 mg | ORAL_CAPSULE | ORAL | 0 refills | Status: DC

## 2018-03-14 NOTE — Progress Notes (Signed)
[facility]  Patient ID: Elizabeth Pratt,  MRN: [id number]  PCP: Burnis Medin, MD  Date of Evaluation: 03/14/2018 Time spent:25 minutes  Chief Complaint:  Chief Complaint    ADHD; Anxiety      HISTORY/CURRENT STATUS: Elizabeth Pratt is seen individually face-to-face with consent not collateral for psychiatric interview and exam in 34-month evaluation and management of ADHD and anxiety usually undermining stabilization as she is with husband psychiatric problems and compensating for husband with her 2 children, being most traumatized by she and husband driving through the town where his previous mistress resided in order to visit friends in [place].  Patient gradually accepts that her overthinking and disorganized processing are definitely anxious and not just ADHD.  She has been in denial years of treatment need here and is no longer in therapy as her therapist has retired for medical reasons.  Husband has also had medical problems in addition to his psychiatric problems.  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  She has hope and loss trauma simultaneously without acting out either in relationships or behavior.  Anxiety  Presents for follow-up visit. Symptoms include compulsions, confusion, decreased concentration, excessive worry, irritability, muscle tension, nervous/anxious behavior and restlessness. Patient reports no depressed mood, malaise, palpitations, panic or suicidal ideas. Symptoms occur most days. The most recent episode lasted 3 hours. The severity of symptoms is moderate and interfering with daily activities. The patient sleeps 5 hours per night. The quality of sleep is fair. Nighttime awakenings: occasional.   Compliance with medications is 76-100%. Side effects of treatment include headaches.    Individual  Medical History/ Review of Systems: Changes? :  She now worries that Adderall 30 mg is exacerbating blood pressure and anxiety.  Allergies: Demerol [meperidine]; Dilaudid [hydromorphone hcl]; Meloxicam; Penicillins; and Sulfonamide derivatives  Current Medications:  Current Outpatient Medications:  .  albuterol (PROVENTIL HFA;VENTOLIN HFA) 108 (90 Base) MCG/ACT inhaler, Inhale 2 puffs into the lungs every 6 (six) hours as needed for wheezing or shortness of breath., Disp: 3 Inhaler, Rfl: 3 .  [START ON 04/25/2018] amphetamine-dextroamphetamine (ADDERALL XR) 20 MG 24 hr capsule, Take 1 capsule (20 mg total) by mouth daily., Disp: 30 capsule, Rfl: 0 .  [START ON 05/25/2018] amphetamine-dextroamphetamine (ADDERALL XR) 20 MG 24 hr capsule, Take 1 capsule (20 mg total) by mouth daily., Disp: 30 capsule, Rfl: 0 .  [START ON 03/26/2018] amphetamine-dextroamphetamine (ADDERALL XR) 25 MG 24 hr capsule, Take 1 capsule by mouth every morning., Disp: 30 capsule, Rfl: 0 .  amphetamine-dextroamphetamine (ADDERALL XR) 30 MG 24 hr capsule, Take 30 mg by mouth every morning., Disp: , Rfl:  .  cetirizine (ZYRTEC) 10 MG tablet, Take 10 mg by mouth daily., Disp: , Rfl:  .  fluticasone (FLONASE) 50 MCG/ACT nasal spray, 1-2 puffs each nostril once daily, Disp: 48 g, Rfl: 3 .  fluticasone furoate-vilanterol (BREO ELLIPTA) 100-25 MCG/INH AEPB, Inhale 1 puff then rinse mouth, once daily, Disp: 180 each, Rfl: 3 .  fluticasone furoate-vilanterol (BREO ELLIPTA) 100-25 MCG/INH AEPB, Inhale 1 puff into the lungs daily., Disp: 28 each, Rfl: 1 .  triamcinolone cream (KENALOG) 0.1 %, Apply 1 application topically 2 (two) times daily., Disp: 80 g, Rfl: 1 Medication Side Effects: Anxiety and blood pressure elevation  Family Medical/ Social History: Changes? Yes and continues to have great deal of difficulty  though she feels he is overall making progress particularly with her help.  MENTAL HEALTH EXAM: Muscle Strength 5/5, postural  reflexes 0/0 and AIMS equals 0 Blood pressure (!) 142/94, pulse 72, height 5' 7.5" (1.715 m), weight 180 lb (81.6 kg).Body mass index is 27.78 kg/m.  General Appearance: Casual and Guarded  Eye Contact:  Good  Speech:  Clear and Coherent  Volume:  Increased  Mood:  Angry, Anxious and Dysphoric  Affect:  Inappropriate, Labile and Anxious  Thought Process:  Goal Directed  Orientation:  Full (Time, Place, and Person)  Thought Content: Obsessions and Rumination   Suicidal Thoughts:  No  Homicidal Thoughts:  No  Memory:  Immediate;   Fair Remote;   Good  Judgement:  Fair  Insight:  Fair  Psychomotor Activity:  Increased  Concentration:  Concentration: Fair and Attention Span: Fair  Recall:  Good  Fund of Knowledge: Fair  Language: Good  Assets:  Resilience Social Support Talents/Skills  ADL's:  Intact  Cognition: WNL  Prognosis:  Good    DIAGNOSES:    ICD-10-CM   1. Attention deficit hyperactivity disorder, combined type, moderate F90.2 amphetamine-dextroamphetamine (ADDERALL XR) 25 MG 24 hr capsule    amphetamine-dextroamphetamine (ADDERALL XR) 20 MG 24 hr capsule    amphetamine-dextroamphetamine (ADDERALL XR) 20 MG 24 hr capsule  2. GAD (generalized anxiety disorder) F41.1     Receiving Psychotherapy: No    RECOMMENDATIONS: For 50% of the time is spent in counseling and coordination of care as patient's anxious symptoms intensify on the Adderall 30 mg XR and she is only now after full of years of titrating up the dose willing to reduce the dose gradually.  She refuses take several days off each week expcting she would decompensate.  She and husband need to resume therapy both are disappointed in experiences they have had in this office as well as elsewhere in town.  We reduce Adderall to 25 mg XR every morning for December prescribed #30 with no refill.  She is then Adderall 20 mg XR as reduction for January that will be continued in February [facility] [address].   Is not been willing to start Zoloft or Pristiq but will continue to facilitate capacity to function that may allow her to think through these treatment options.  She returns in 3 months.  Delight Hoh, MD

## 2018-04-02 NOTE — Assessment & Plan Note (Addendum)
At previous PFT, airflow was better preserved than expected for her smoking history.  Clinically she still has a significant reversible component with recent exacerbation. Plan-Xopenex nebulizer treatment, Depo-Medrol, sample Breo discussion she may not benefit from long-term use of a maintenance controller.  Consider updating PFT.  CXR

## 2018-04-02 NOTE — Assessment & Plan Note (Signed)
She is committed and motivated now to remain abstinent from tobacco use.

## 2018-04-26 ENCOUNTER — Encounter (HOSPITAL_COMMUNITY): Payer: Self-pay

## 2018-04-26 ENCOUNTER — Ambulatory Visit (INDEPENDENT_AMBULATORY_CARE_PROVIDER_SITE_OTHER): Payer: 59

## 2018-04-26 ENCOUNTER — Ambulatory Visit (HOSPITAL_COMMUNITY)
Admission: EM | Admit: 2018-04-26 | Discharge: 2018-04-26 | Disposition: A | Discharge status: Home / Self Care | Payer: 59 | Attending: Family Medicine | Admitting: Family Medicine

## 2018-04-26 DIAGNOSIS — W228XXA Striking against or struck by other objects, initial encounter: Secondary | ICD-10-CM | POA: Diagnosis not present

## 2018-04-26 DIAGNOSIS — S9032XA Contusion of left foot, initial encounter: Secondary | ICD-10-CM | POA: Diagnosis not present

## 2018-04-26 NOTE — Discharge Instructions (Signed)
No fracture Use anti-inflammatories for pain/swelling. You may take up to 800 mg Ibuprofen every 8 hours with food. You may supplement Ibuprofen with Tylenol [phone] mg every 8 hours.  Ice and elevate

## 2018-04-26 NOTE — ED Triage Notes (Signed)
Pt presents with left foot pain after dropping a heavy object on it from a shelf.

## 2018-04-26 NOTE — ED Provider Notes (Signed)
[facility]    CSN: [id number] Arrival date & time: 04/26/18  [place]     History   Chief Complaint Chief Complaint  Patient presents with  . Foot Pain    HPI Elizabeth Pratt is a 58 y.o. female no contributing past medical history presenting today for evaluation of left foot injury.  Last night patient was removing something from a shelf and accidentally dropped it and landed on her left foot.  Since she has had increased pain and swelling and discoloration.  Notes that she was concerned as she felt her foot was colder than her right 1.  She is also had random spurts of pain without movement.  HPI  Past Medical History:  Diagnosis Date  . Asthma dx age 54  . Diverticulitis   . Diverticulosis   . Migraines    poss atypical ? MRI  . Obesity (BMI 30-39.9) 03/26/2014  . Pneumonia   . Vertigo     Patient Active Problem List   Diagnosis Date Noted  . GAD (generalized anxiety disorder) 02/25/2018  . Attention deficit hyperactivity disorder, combined type, moderate 02/25/2018  . Acute sinusitis 09/27/2015  . Eustachian tube dysfunction 05/14/2015  . Fatty liver 03/26/2014  . Renal stone 03/26/2014  . Acute bronchitis 03/26/2014  . Diverticulitis 03/25/2014  . Sigmoid diverticulitis 03/25/2014  . Asthma, chronic 03/25/2014  . Acute upper respiratory infections of unspecified site 03/12/2013  . Family history of thyroid disease 07/24/2012  . Hair thinning 07/24/2012  . Acute infective gastroenteritis 02/25/2011  . BENIGN PAROXYSMAL POSITIONAL VERTIGO 04/10/2009  . Seasonal and perennial allergic rhinitis 04/13/2008  . TOBACCO USE in remission 02/20/2008  . COUGH 02/20/2008  . Asthma with bronchitis 01/16/2008    Past Surgical History:  Procedure Laterality Date  . dental extractions    . dental extractions      OB History   No obstetric history on file.      Home Medications    Prior to Admission medications   Medication Sig Start Date End Date  Taking? Authorizing Provider  albuterol (PROVENTIL HFA;VENTOLIN HFA) 108 (90 Base) MCG/ACT inhaler Inhale 2 puffs into the lungs every 6 (six) hours as needed for wheezing or shortness of breath. 02/02/18   Baird Lyons D, MD  amphetamine-dextroamphetamine (ADDERALL XR) 20 MG 24 hr capsule Take 1 capsule (20 mg total) by mouth daily. 04/25/18 05/25/18  Delight Hoh, MD  amphetamine-dextroamphetamine (ADDERALL XR) 20 MG 24 hr capsule Take 1 capsule (20 mg total) by mouth daily. 05/25/18 06/24/18  Delight Hoh, MD  amphetamine-dextroamphetamine (ADDERALL XR) 25 MG 24 hr capsule Take 1 capsule by mouth every morning. 03/26/18 04/25/18  Delight Hoh, MD  amphetamine-dextroamphetamine (ADDERALL XR) 30 MG 24 hr capsule Take 30 mg by mouth every morning. 12/23/17   [provider]  cetirizine (ZYRTEC) 10 MG tablet Take 10 mg by mouth daily.    [provider]  fluticasone (FLONASE) 50 MCG/ACT nasal spray 1-2 puffs each nostril once daily 02/02/18   Baird Lyons D, MD  fluticasone furoate-vilanterol (BREO ELLIPTA) 100-25 MCG/INH AEPB Inhale 1 puff then rinse mouth, once daily 02/02/18   Young, Tarri Fuller D, MD  fluticasone furoate-vilanterol (BREO ELLIPTA) 100-25 MCG/INH AEPB Inhale 1 puff into the lungs daily. 02/02/18   Baird Lyons D, MD  triamcinolone cream (KENALOG) 0.1 % Apply 1 application topically 2 (two) times daily. 11/25/16   Panosh, Standley Brooking, MD    Family History Family History  Problem Relation Age of  Onset  . Asthma Mother   . Breast cancer Mother   . Asthma Sister        smoker  . Asthma Sister        smoker  . Breast cancer Sister   . Ovarian cancer Sister   . ADD / ADHD Son     Social History Social History   Tobacco Use  . Smoking status: Former Smoker    Packs/day: 0.10    Types: Cigarettes    Last attempt to quit: 03/30/2014    Years since quitting: 4.0  . Smokeless tobacco: Never Used  . Tobacco comment: has quit "lots of times"    Substance Use Topics  . Alcohol use: Yes  . Drug use: No     Allergies   Demerol [meperidine]; Dilaudid [hydromorphone hcl]; Meloxicam; Penicillins; and Sulfonamide derivatives   Review of Systems Review of Systems  Constitutional: Negative for fatigue and fever.  Eyes: Negative for visual disturbance.  Respiratory: Negative for shortness of breath.   Cardiovascular: Negative for chest pain.  Gastrointestinal: Negative for abdominal pain, nausea and vomiting.  Musculoskeletal: Positive for arthralgias, gait problem and joint swelling.  Skin: Positive for color change. Negative for rash and wound.  Neurological: Negative for dizziness, weakness, light-headedness and headaches.     Physical Exam Triage Vital Signs ED Triage Vitals [04/26/18 1917]  Enc Vitals Group     BP (!) 177/77     Pulse Rate 84     Resp 20     Temp 98.3 F (36.8 C)     Temp Source Oral     SpO2 96 %     Weight      Height      Head Circumference      Peak Flow      Pain Score 7     Pain Loc      Pain Edu?      Excl. in [place]?    No data found.  Updated Vital Signs BP (!) 177/77 (BP Location: Left Arm)   Pulse 84   Temp 98.3 F (36.8 C) (Oral)   Resp 20   SpO2 96%   Visual Acuity Right Eye Distance:   Left Eye Distance:   Bilateral Distance:    Right Eye Near:   Left Eye Near:    Bilateral Near:     Physical Exam Vitals signs and nursing note reviewed.  Constitutional:      Appearance: She is well-developed.     Comments: No acute distress  HENT:     Head: Normocephalic and atraumatic.     Nose: Nose normal.  Eyes:     Conjunctiva/sclera: Conjunctivae normal.  Neck:     Musculoskeletal: Neck supple.  Cardiovascular:     Rate and Rhythm: Normal rate.  Pulmonary:     Effort: Pulmonary effort is normal. No respiratory distress.  Abdominal:     General: There is no distension.  Musculoskeletal: Normal range of motion.     Comments: Left foot with swelling and discoloration  to second toe, tenderness to palpation of distal first through third metatarsals tenderness to palpation over second toe.  Dorsalis pedis 2+, cap refill less than 2 seconds  Skin:    General: Skin is warm and dry.  Neurological:     Mental Status: She is alert and oriented to person, place, and time.      UC Treatments / Results  Labs (all labs ordered are listed, but only abnormal results  are displayed) Labs Reviewed - No data to display  EKG None  Radiology Dg Foot Complete Left  Result Date: 04/26/2018 CLINICAL DATA:  Pain and bruising of the left foot. EXAM: LEFT FOOT - COMPLETE 3+ VIEW COMPARISON:  None. FINDINGS: There is no evidence of fracture or dislocation. There is no evidence of arthropathy or other focal bone abnormality. Soft tissues are unremarkable. IMPRESSION: Negative. Electronically Signed   By: Fidela Salisbury M.D.   On: 04/26/2018 19:47    Procedures Procedures (including critical care time)  Medications Ordered in UC Medications - No data to display  Initial Impression / Assessment and Plan / UC Course  I have reviewed the triage vital signs and the nursing notes.  Pertinent labs & imaging results that were available during my care of the patient were reviewed by me and considered in my medical decision making (see chart for details).    No fracture noted on x-ray.  Appears neurovascularly intact although foot does feel slightly cool.  Will recommend anti-inflammatories, ice and elevation.  Continue to monitor for improvement in swelling and temperature with decreased swelling. Discussed strict return precautions. Patient verbalized understanding and is agreeable with plan.   Final Clinical Impressions(s) / UC Diagnoses   Final diagnoses:  Contusion of left foot, initial encounter     Discharge Instructions     No fracture Use anti-inflammatories for pain/swelling. You may take up to 800 mg Ibuprofen every 8 hours with food. You may  supplement Ibuprofen with Tylenol [phone] mg every 8 hours.  Ice and elevate    ED Prescriptions    None     Controlled Substance Prescriptions [place] Controlled Substance Registry consulted? Not Applicable   Janith Lima, [place] 04/26/18 [phone]

## 2018-06-13 ENCOUNTER — Ambulatory Visit: Payer: 59 | Admitting: Psychiatry

## 2018-06-13 ENCOUNTER — Encounter: Payer: Self-pay | Admitting: Psychiatry

## 2018-06-13 VITALS — BP 122/88 | HR 80 | Ht 67.0 in | Wt 182.0 lb

## 2018-06-13 DIAGNOSIS — F902 Attention-deficit hyperactivity disorder, combined type: Secondary | ICD-10-CM | POA: Diagnosis not present

## 2018-06-13 DIAGNOSIS — F411 Generalized anxiety disorder: Secondary | ICD-10-CM

## 2018-06-13 MED ORDER — AMPHETAMINE-DEXTROAMPHET ER 20 MG PO CP24: 20.0000 mg | ORAL_CAPSULE | Freq: Every day | ORAL | 0 refills | Status: DC

## 2018-06-13 NOTE — Progress Notes (Signed)
[facility]  Patient ID: Elizabeth Pratt,  MRN: [id number]  PCP: Burnis Medin, MD  Date of Evaluation: 06/13/2018 Time spent:25 minutes  Chief Complaint:  Chief Complaint    ADHD; Anxiety      HISTORY/CURRENT STATUS: Elizabeth Pratt is seen individually face-to-face with consent not collateral for psychiatric interview and exam in 2-month evaluation and management of ADHD and generalized anxiety as her children perceive her as less personable and more driven by problems when on Adderall so that she had agreed to taper the dose at last appointment.  She emphasizes that she has only reduced dose 5 mg as she was not on 30 mg XR for long, though in the last 2 months she has been taking 20 mg XR every morning.  Patient maintains that the medication allows her to see reality more clearly while acknowledging some overstimulation at times in the past that resulted in an unrealistic view of relationships and life.  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  The patient now has a new opportunity in the making for selling her art.  In the last 3 months she has done well in coping with husband falling from a height at home multiple fractures from the waist up including the odontoid process without permanent impairment.  They continue to follow a strict diet but she has gained 2 pounds on lower Adderall though she still maintains that she never lost weight from Adderall.  She formulates reasons to go back up on the dose without realizing the reason for opportunities and conflicts in reducing the dose.  She doubts that she has anxiety but worries about all of these matters significantly though in ways she considers just being a person.  Still she does describe that the family has functioned well together through father's injury with many  blessings and can be positive about his improvement.  Anxiety  Presents for follow-up visit. Symptoms include decreased concentration, excessive worry, irritability, muscle tension, nervous/anxious behavior and restlessness. Patient reports no confusion, depressed mood, insomnia, panic or suicidal ideas. Symptoms occur most days. The quality of sleep is good. Nighttime awakenings: occasional.   Side effects of treatment include joint pain.    Individual Medical History/ Review of Systems: Changes? :No   Allergies: Demerol [meperidine]; Dilaudid [hydromorphone hcl]; Meloxicam; Penicillins; and Sulfonamide derivatives  Current Medications:  Current Outpatient Medications:  .  albuterol (PROVENTIL HFA;VENTOLIN HFA) 108 (90 Base) MCG/ACT inhaler, Inhale 2 puffs into the lungs every 6 (six) hours as needed for wheezing or shortness of breath., Disp: 3 Inhaler, Rfl: 3 .  amphetamine-dextroamphetamine (ADDERALL XR) 20 MG 24 hr capsule, Take 1 capsule (20 mg total) by mouth daily., Disp: 30 capsule, Rfl: 0 .  amphetamine-dextroamphetamine (ADDERALL XR) 20 MG 24 hr capsule, Take 1 capsule (20 mg total) by mouth daily., Disp: 30 capsule, Rfl: 0 .  amphetamine-dextroamphetamine (ADDERALL XR) 25 MG 24 hr capsule, Take 1 capsule by mouth every morning., Disp: 30 capsule, Rfl: 0 .  amphetamine-dextroamphetamine (ADDERALL XR) 30 MG 24 hr capsule, Take 30 mg by mouth every morning., Disp: , Rfl:  .  cetirizine (ZYRTEC) 10 MG tablet, Take 10 mg by mouth daily., Disp: , Rfl:  .  fluticasone (FLONASE) 50 MCG/ACT nasal spray, 1-2 puffs each nostril once daily, Disp: 48 g, Rfl: 3 .  fluticasone furoate-vilanterol (  BREO ELLIPTA) 100-25 MCG/INH AEPB, Inhale 1 puff then rinse mouth, once daily, Disp: 180 each, Rfl: 3 .  fluticasone furoate-vilanterol (BREO ELLIPTA) 100-25 MCG/INH AEPB, Inhale 1 puff into the lungs daily., Disp: 28 each, Rfl: 1 .  triamcinolone cream (KENALOG) 0.1 %, Apply 1 application topically 2  (two) times daily., Disp: 80 g, Rfl: 1   Medication Side Effects: none  Family Medical/ Social History: Changes? Yes she has more focus upon her art and her family less upon disappointments of work and treatment.  MENTAL HEALTH EXAM: Muscle strengths and tone 5/5, postural reflexes and gait 0/0, and AIMS = 0. Blood pressure 122/88, pulse 80, height 5\' 7"  (1.702 m), weight 182 lb (82.6 kg).Body mass index is 28.51 kg/m.  General Appearance: Casual, Guarded, Meticulous and Well Groomed  Eye Contact:  Good  Speech:  Clear and Coherent, Normal Rate and Talkative  Volume:  Normal  Mood:  Anxious, Euthymic and Irritable  Affect:  Labile, Full Range and Anxious  Thought Process:  Goal Directed, Irrelevant and Linear  Orientation:  Full (Time, Place, and Person)  Thought Content: Obsessions and Rumination   Suicidal Thoughts:  No  Homicidal Thoughts:  No  Memory:  Immediate;   Good Remote;   Good  Judgement:  Fair  Insight:  Fair  Psychomotor Activity:  Normal, Increased and Mannerisms  Concentration:  Concentration: Good and Attention Span: Fair  Recall:  Good  Fund of Knowledge: Good  Language: Good  Assets:  Desire for Improvement Resilience Talents/Skills Vocational/Educational  ADL's:  Intact  Cognition: WNL  Prognosis:  Good    DIAGNOSES:    ICD-10-CM   1. Attention deficit hyperactivity disorder, combined type, moderate F90.2   2. GAD (generalized anxiety disorder) F41.1     Receiving Psychotherapy: No    RECOMMENDATIONS: Over 50% of the time is spent in counseling and coordination of care cognitive behavioral interventions for relations, occupation, and adult development.  By such processing, session can achieve the patient's willingness and capacity to maintain the Adderall at 20 mg XR every morning sent as a 30-day supply each for March, April, and [facility] [address] [facility].  The course of treatment mobilization of anxiety and ways to limit its  impairment of concentration, relationships, and activities continues slowly to establish overall therapeutic success.  Still goals are in evolution maintain current treatment for 3 months to re turn at that time.   Delight Hoh, MD

## 2018-06-20 ENCOUNTER — Telehealth: Payer: Self-pay | Admitting: Internal Medicine

## 2018-06-20 MED ORDER — FLUTICASONE FUROATE-VILANTEROL 100-25 MCG/INH IN AEPB: INHALATION_SPRAY | RESPIRATORY_TRACT | 3 refills | Status: DC

## 2018-06-20 NOTE — Telephone Encounter (Signed)
Spoke with pt and advised that refill for Memory Dance was sent to pharmacy.  Pt verbalized understanding.  Nothing further needed.

## 2018-06-22 ENCOUNTER — Telehealth: Payer: Self-pay | Admitting: Psychiatry

## 2018-06-22 DIAGNOSIS — F902 Attention-deficit hyperactivity disorder, combined type: Secondary | ICD-10-CM

## 2018-06-22 MED ORDER — AMPHETAMINE-DEXTROAMPHET ER 25 MG PO CP24: 25.0000 mg | ORAL_CAPSULE | Freq: Every day | ORAL | 0 refills | Status: DC

## 2018-06-22 NOTE — Telephone Encounter (Signed)
Patient left vm @1 :06 today stated that [facility] [address] was not able to fill script for Adderall 20 mg. And they are not able to transfer script bc it's a narcotic.  Please advise patient as to what she needs to do

## 2018-06-22 NOTE — Addendum Note (Signed)
Addended by: Delight Hoh on: 06/22/2018 03:55 PM   Modules accepted: Orders

## 2018-06-22 NOTE — Telephone Encounter (Signed)
Patient has worked with [facility] [address] to clarify that only the [place] store has any Adderall 20 mg XR insufficient for her prescription she is stressed by the current pandemic as for her lack of asthma medications and higher risk of stress associated asthma.  She must be able to function and will switch back to the Adderall 25 mg XR which [facility] no refill medically necessary no contraindication [facility] to [facility] [address] then to resume the 20 mg XR daily with existing prescriptions the month thereafter, destroying any print form of this escription as it been in 2019 when entered into epic.

## 2018-07-05 ENCOUNTER — Telehealth: Payer: Self-pay | Admitting: Internal Medicine

## 2018-07-05 MED ORDER — ALBUTEROL SULFATE (2.5 MG/3ML) 0.083% IN NEBU: 2.5000 mg | INHALATION_SOLUTION | Freq: Four times a day (QID) | RESPIRATORY_TRACT | 12 refills | Status: AC | PRN

## 2018-07-05 NOTE — Telephone Encounter (Signed)
LVM to advise the prescription will be sent to the pharmacy and to call if anything else will be needed.

## 2018-07-05 NOTE — Telephone Encounter (Signed)
Does she need a compressor nebulizer also?   Ok to Rx albuterol 0.083% neb solution, 75 ml,    1 neb every 6 hours, ref x 1 year

## 2018-07-05 NOTE — Telephone Encounter (Signed)
Returned call to patient. She is not having any breathing issues at this time. She states asthma flare in spring and fall. With corona virus scares she is requesting neb medication for prn use. She just wants to have on hand. Please advise.

## 2018-07-22 ENCOUNTER — Other Ambulatory Visit: Payer: Self-pay

## 2018-07-22 ENCOUNTER — Telehealth: Payer: Self-pay | Admitting: Psychiatry

## 2018-07-22 DIAGNOSIS — F902 Attention-deficit hyperactivity disorder, combined type: Secondary | ICD-10-CM

## 2018-07-22 MED ORDER — AMPHETAMINE-DEXTROAMPHET ER 25 MG PO CP24: 25.0000 mg | ORAL_CAPSULE | Freq: Every day | ORAL | 0 refills | Status: DC

## 2018-07-22 NOTE — Telephone Encounter (Signed)
Phone request by patient to continue Adderall at 25 mg XR rather than 20 mg XR agreed-upon last 2 appointments.  There is no contraindication currently as I request [facility] [address] to cancel the 4/8 E scription sent 06/13/2018 canceled in epic and note to pharmacy on new prescription.  Patient expects this can in someway be generalized to all eScription for Adderall, but the existing one for 08/12/2018 will require her request again not to be filled later not otherwise knowing when and where she will fill it and not change her mind again.  The ease of refills she seeks is best secured by discontinuing controlled substance for an alternative ADHD treatment that is not controlled such as an upcoming 09/14/2018 appointment. My first attempt to make these changes froze the computer and epic but was able to send the eScription 30 minutes later at which time at which time [facility] activated 3 times but I cannot determine that any other eScription was signed or that the needed one was left out.

## 2018-07-22 NOTE — Telephone Encounter (Signed)
Cianna called in to request refill of Adderall 25mg .  You changed it to 25mg  last time and it is better .  Please cancell refills for 20mg  and submit refills for 25mg .  Next appt 09/05/18.  [facility] [address]

## 2018-08-24 ENCOUNTER — Telehealth: Payer: Self-pay | Admitting: Psychiatry

## 2018-08-24 DIAGNOSIS — F902 Attention-deficit hyperactivity disorder, combined type: Secondary | ICD-10-CM

## 2018-08-24 MED ORDER — AMPHETAMINE-DEXTROAMPHET ER 25 MG PO CP24: 25.0000 mg | ORAL_CAPSULE | Freq: Every day | ORAL | 0 refills | Status: DC

## 2018-08-24 NOTE — Telephone Encounter (Signed)
Patient has maintained the need for Adderall 25 mg XR sent to [address] [facility] to [facility] of the 20 mg XR sent 06/13/2018 for fill 08/12/2018.

## 2018-08-24 NOTE — Telephone Encounter (Signed)
Patient stated the script for Adderall was suppose to be for 25 mg., instead of 20 mg., the pharmacy [facility] [address] still [facility] scripts for 20 mg., she needs script sent for 25 mg.

## 2018-09-05 ENCOUNTER — Encounter: Payer: Self-pay | Admitting: Psychiatry

## 2018-09-05 ENCOUNTER — Ambulatory Visit (INDEPENDENT_AMBULATORY_CARE_PROVIDER_SITE_OTHER): Payer: 59 | Admitting: Psychiatry

## 2018-09-05 ENCOUNTER — Other Ambulatory Visit: Payer: Self-pay

## 2018-09-05 DIAGNOSIS — F411 Generalized anxiety disorder: Secondary | ICD-10-CM

## 2018-09-05 DIAGNOSIS — F902 Attention-deficit hyperactivity disorder, combined type: Secondary | ICD-10-CM | POA: Diagnosis not present

## 2018-09-05 MED ORDER — AMPHETAMINE-DEXTROAMPHET ER 25 MG PO CP24: 25.0000 mg | ORAL_CAPSULE | Freq: Every day | ORAL | 0 refills | Status: DC

## 2018-09-05 NOTE — Progress Notes (Signed)
[facility]  Patient ID: Elizabeth Pratt,  MRN: [id number]  PCP: Burnis Medin, MD  Date of Evaluation: 09/05/2018 Time spent:20 minutes from 1300 to 1320  Chief Complaint:  Chief Complaint    ADHD; Anxiety      HISTORY/CURRENT STATUS: Elizabeth Pratt is seen individually face-to-face with consent not collateral for psychiatric interview and exam in 36-month evaluation and management of ADHD and generalized anxiety with employment and family consequences and comorbidities.  She processes that husband is again somewhat stuck in his current psychotherapy which she believes started with their psychologist here in this office now retired who sequentially confronted then endorsed different views for herself and her husband when husband must be black-and-white in his scientific survey of the quality of life.  She then reviews son in [place] having break-up with girlfriend being alone in the city with the pandemic.  Daughter is in [place] working at [employer] in [employer] but offered protection by the officers who know her from the food bank.  Cluster B denial mobilizes then obscures such conflicts for limiting working through, though patient is likely the strength of the family.  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  The family is no longer confronting her self treatment with Adderall now back to 25 mg down from maximum of 30 but not functioning adequately on 20 mg.  She does admit that she worries about weight gain if she stops the medication even though she denies that the medication helps her lose weight.  She does allow explanation for the management details necessary when she obtains Adderall 20 mg eScriptions then switches to 25 mg XR wanting all changed immediately without full course of processing for resolution.  She has no mania, psychosis, dissociation, delirium, or substance use.  Anxiety  Presents for follow-up visit. Symptoms include decreased concentration, excessive  worry, irritability, muscle tension, nervous/anxious behavior and restlessness. Patient reports no confusion, depressed mood, insomnia, panic or suicidal ideas. Symptoms occur most days. The quality of sleep is good. Nighttime awakenings: occasional.  Side effects of treatment include joint pain.   She is medication compliant 76 to 100% of the time.  Individual Medical History/ Review of Systems: Changes? :No   Allergies: Demerol [meperidine]; Dilaudid [hydromorphone hcl]; Meloxicam; Penicillins; and Sulfonamide derivatives  Current Medications:  Current Outpatient Medications:  .  albuterol (PROVENTIL HFA;VENTOLIN HFA) 108 (90 Base) MCG/ACT inhaler, Inhale 2 puffs into the lungs every 6 (six) hours as needed for wheezing or shortness of breath., Disp: 3 Inhaler, Rfl: 3 .  albuterol (PROVENTIL) (2.5 MG/3ML) 0.083% nebulizer solution, Take 3 mLs (2.5 mg total) by nebulization every 6 (six) hours as needed for wheezing or shortness of breath., Disp: 75 mL, Rfl: 12 .  [START ON 09/22/2018] amphetamine-dextroamphetamine (ADDERALL XR) 25 MG 24 hr capsule, Take 1 capsule by mouth daily after breakfast for 30 days., Disp: 30 capsule, Rfl: 0 .  [START ON 10/22/2018] amphetamine-dextroamphetamine (ADDERALL XR) 25 MG 24 hr capsule, Take 1 capsule by mouth daily after breakfast for 30 days., Disp: 30 capsule, Rfl: 0 .  [START ON 11/21/2018] amphetamine-dextroamphetamine (ADDERALL XR) 25 MG 24 hr capsule, Take 1 capsule by mouth daily after breakfast for 30 days., Disp: 30 capsule, Rfl: 0 .  cetirizine (ZYRTEC) 10 MG tablet, Take 10 mg by mouth daily., Disp: , Rfl:  .  fluticasone (FLONASE) 50 MCG/ACT nasal spray, 1-2 puffs each nostril once daily, Disp: 48 g, Rfl: 3 .  fluticasone furoate-vilanterol (BREO ELLIPTA) 100-25  MCG/INH AEPB, Inhale 1 puff into the lungs daily., Disp: 28 each, Rfl: 1 .  fluticasone furoate-vilanterol (BREO ELLIPTA) 100-25 MCG/INH AEPB, Inhale 1 puff then rinse mouth, once daily, Disp:  180 each, Rfl: 3 .  triamcinolone cream (KENALOG) 0.1 %, Apply 1 application topically 2 (two) times daily., Disp: 80 g, Rfl: 1 Medication Side Effects: none  Family Medical/ Social History: Changes? Yes husband and children all having life conflicts she attempts to solve  [place]:  There were no vitals taken for this visit.There is no height or weight on file to calculate BMI.  Deferred due to the coronavirus pandemic  General Appearance: Casual, Fairly Groomed, Guarded and Meticulous  Eye Contact:  Good  Speech:  Clear and Coherent, Normal Rate and Talkative  Volume:  Normal  Mood:  Angry, Anxious, Dysphoric, Euthymic and Irritable  Affect:  Inappropriate, Labile, Full Range and Anxious  Thought Process:  Goal Directed, Irrelevant and Linear  Orientation:  Full (Time, Place, and Person)  Thought Content: Obsessions and Rumination   Suicidal Thoughts:  No  Homicidal Thoughts:  No  Memory:  Immediate;   Good Remote;   Good  Judgement:  Fair  Insight:  Fair  Psychomotor Activity:  Normal, Increased, Mannerisms and Restlessness  Concentration:  Concentration: Fair and Attention Span: Poor  Recall:  Good  Fund of Knowledge: Fair  Language: Good  Assets:  Desire for Improvement Resilience Social Support Talents/Skills  ADL's:  Intact  Cognition: WNL  Prognosis:  Good    DIAGNOSES:    ICD-10-CM   1. Attention deficit hyperactivity disorder, combined type, moderate F90.2 amphetamine-dextroamphetamine (ADDERALL XR) 25 MG 24 hr capsule    amphetamine-dextroamphetamine (ADDERALL XR) 25 MG 24 hr capsule    amphetamine-dextroamphetamine (ADDERALL XR) 25 MG 24 hr capsule  2. GAD (generalized anxiety disorder) F41.1     Receiving Psychotherapy: No Therapist retired for health reasons and patient is now integrating her need for therapy into psychosupportive care here.   RECOMMENDATIONS: [place] registry documents appropriate fills including all 3 of the previous Adderall 20  mg options converted to 25 mg step-by-step that she is E scribed today Adderall 25 mg start taking 1 every morning as a month supply each for 18th, July 18, and August 17 [facility].  Over 50% of the time is spent in counseling and coordination of care reworking conflicts and undoing consequences so patient continues to function well including for all of her family.  She continues such therapy and medication management every 3 months as her next follow-up.   Delight Hoh, MD

## 2018-09-29 LAB — HM PAP SMEAR

## 2018-10-24 ENCOUNTER — Encounter: Payer: Self-pay | Admitting: Internal Medicine

## 2018-11-13 IMAGING — DX DG CHEST 2V
2 series · 2 of 2 positions shown · non-contrast
Comparison: 05/10/2010.

CLINICAL DATA: Cough and chest congestion for the past week.
Ex-smoker.

EXAM:
CHEST - 2 VIEW

[chest pa]
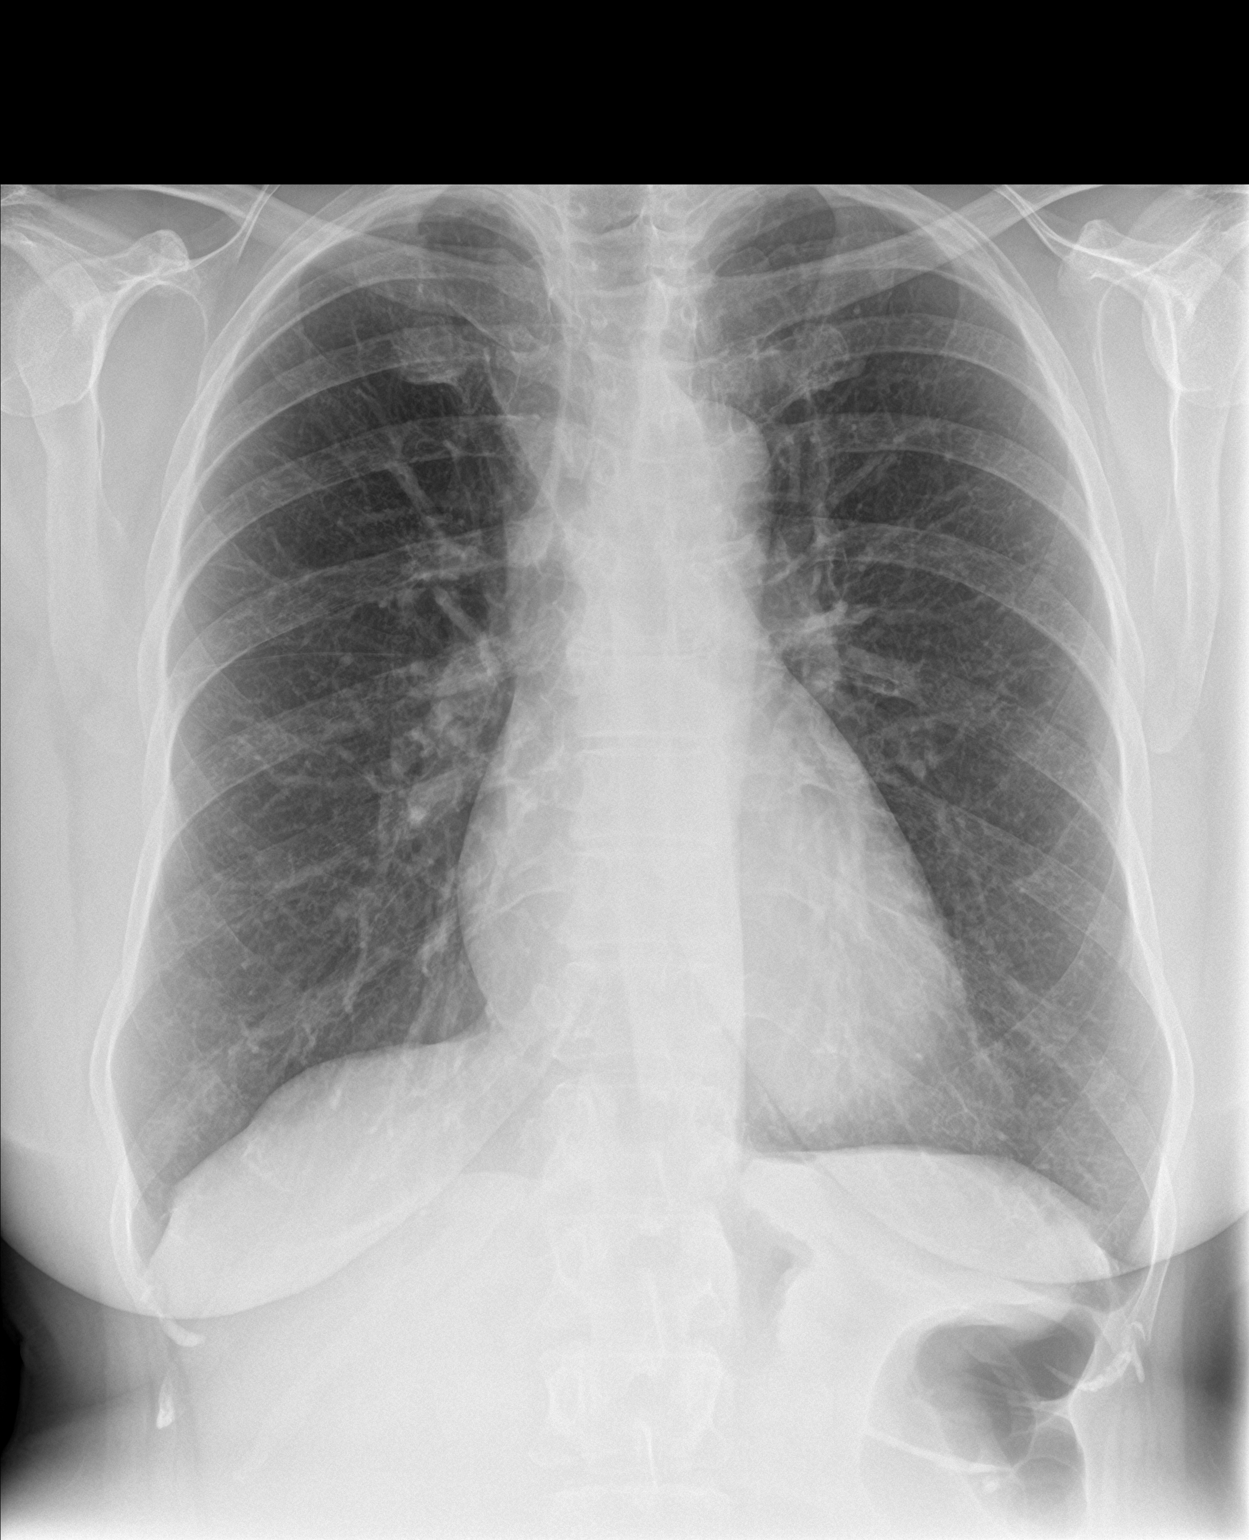

[chest lat]
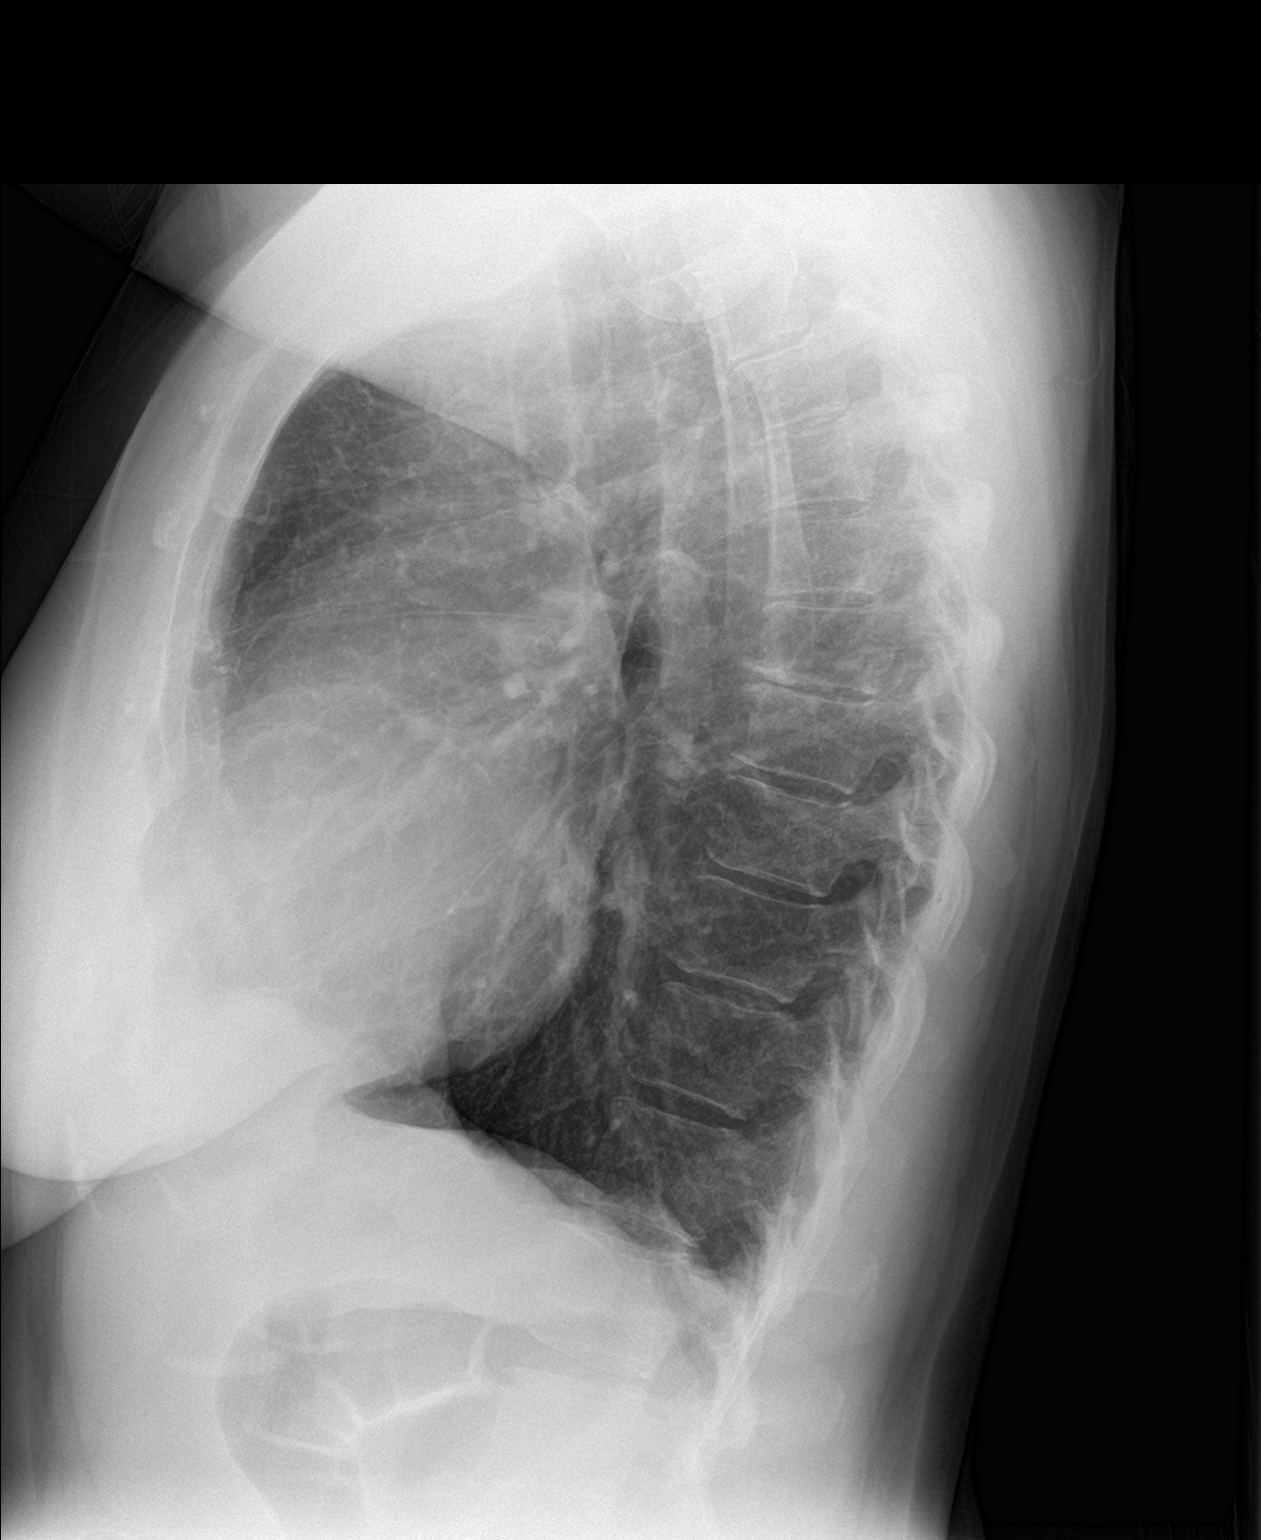

[2 of 2 positions shown; findings below may reference images not displayed]

FINDINGS: Normal sized heart. Clear lungs. The lungs are hyperexpanded with
diffuse peribronchial thickening with mild progression. Mild
thoracic spine degenerative changes.
IMPRESSION: Mildly progressive changes of COPD and chronic bronchitis.

## 2018-12-06 ENCOUNTER — Encounter: Payer: Self-pay | Admitting: Psychiatry

## 2018-12-06 ENCOUNTER — Ambulatory Visit (INDEPENDENT_AMBULATORY_CARE_PROVIDER_SITE_OTHER): Payer: 59 | Admitting: Psychiatry

## 2018-12-06 ENCOUNTER — Other Ambulatory Visit: Payer: Self-pay

## 2018-12-06 DIAGNOSIS — F411 Generalized anxiety disorder: Secondary | ICD-10-CM | POA: Diagnosis not present

## 2018-12-06 DIAGNOSIS — F902 Attention-deficit hyperactivity disorder, combined type: Secondary | ICD-10-CM | POA: Diagnosis not present

## 2018-12-06 MED ORDER — AMPHETAMINE-DEXTROAMPHET ER 25 MG PO CP24: 25.0000 mg | ORAL_CAPSULE | Freq: Every day | ORAL | 0 refills | Status: DC

## 2018-12-06 NOTE — Progress Notes (Signed)
[facility]  Patient ID: Elizabeth Pratt,  MRN: [id number]  PCP: Burnis Medin, MD  Date of Evaluation: 12/06/2018 Time spent:20 minutes from 1320 to 1340   Chief Complaint:  Chief Complaint    ADHD; Anxiety      HISTORY/CURRENT STATUS: Elizabeth Pratt is seen onsite in person face-to-face with consent individually with epic collateral for psychiatric interview and exam in 17-month evaluation and management of ADHD comorbid with generalized anxiety as cluster B traits dissipate in the course of gradually more successful family development.  Elizabeth Pratt integrates today systems and object relation structures from the family diathesis of each family member past and present.  Husband is turning 58 years of age shortly after she assembles children and husband at the beach for a weekend. As she prepares with doubt to be needed, she expectantly navigates and negotiates for each their past and present reactions to each other.  Husband continues therapy but patient has not resumed therapy since Arvil Chaco, LCSW retired for illness.  Patient is married for 33 years with her greatest challenge having been husband's relative affair 4 1/2 years ago over which they still have conflicts, even though husband worries most that he may die when he turns 48 like the death of his own father predicting such would be due to consequences of his actions rather than time course of health itself.  Patient is concerned that taking a day off of Adderall will be explosive as it was for her son when on Vyvanse in the past.  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  She has no mania, suicidality, psychosis, or delirium.  Anxiety         Presents forfollow-upvisit. Symptoms includedecreased concentration,excessive worry,  avoidance, inhibition, repetitive reaction formation,muscle tension,nervous/anxious behaviorand restlessness. Patient reports noconfusion,irritability, depressed mood,insomnia,panic,or suicidal ideas. Symptoms occurevery day, addressing modulation and self-directed recovery. The quality of sleep isgood. Nighttime awakenings:occasional. Side effects of treatment includejoint pain.  She is medication compliant 76 to 100% of the time, but the integration of actual and station anxiety and to effective coping will require facilitation of the Adderall to become more intermittent for or successful generalization and reinforcement.  Individual Medical History/ Review of Systems: Changes? :Yes  Interim mammogram and orthopedic left wrist exam, patient declining weight other vitals apprehensive for COVID exposure while generally denying anxiety herself  Allergies: Demerol [meperidine], Dilaudid [hydromorphone hcl], Meloxicam, Penicillins, and Sulfonamide derivatives  Current Medications:  Current Outpatient Medications:  .  albuterol (PROVENTIL HFA;VENTOLIN HFA) 108 (90 Base) MCG/ACT inhaler, Inhale 2 puffs into the lungs every 6 (six) hours as needed for wheezing or shortness of breath., Disp: 3 Inhaler, Rfl: 3 .  albuterol (PROVENTIL) (2.5 MG/3ML) 0.083% nebulizer solution, Take 3 mLs (2.5 mg total) by nebulization every 6 (six) hours as needed for wheezing or shortness of breath., Disp: 75 mL, Rfl: 12 .  [START ON 12/20/2018] amphetamine-dextroamphetamine (ADDERALL XR) 25 MG 24 hr capsule, Take 1 capsule by mouth daily after breakfast., Disp: 30 capsule, Rfl: 0 .  [START ON 01/19/2019] amphetamine-dextroamphetamine (ADDERALL XR) 25 MG 24 hr capsule, Take 1 capsule by mouth daily after breakfast., Disp: 30 capsule, Rfl: 0 .  [START ON 02/18/2019] amphetamine-dextroamphetamine (ADDERALL XR) 25 MG 24 hr capsule, Take 1 capsule by mouth daily after breakfast., Disp: 30  capsule, Rfl: 0 .  cetirizine  (ZYRTEC) 10 MG tablet, Take 10 mg by mouth daily., Disp: , Rfl:  .  fluticasone (FLONASE) 50 MCG/ACT nasal spray, 1-2 puffs each nostril once daily, Disp: 48 g, Rfl: 3 .  fluticasone furoate-vilanterol (BREO ELLIPTA) 100-25 MCG/INH AEPB, Inhale 1 puff into the lungs daily., Disp: 28 each, Rfl: 1 .  fluticasone furoate-vilanterol (BREO ELLIPTA) 100-25 MCG/INH AEPB, Inhale 1 puff then rinse mouth, once daily, Disp: 180 each, Rfl: 3 .  triamcinolone cream (KENALOG) 0.1 %, Apply 1 application topically 2 (two) times daily., Disp: 80 g, Rfl: 1   Medication Side Effects: none  Family Medical/ Social History: Changes? No mother had breast cancer & son ADHD.  MENTAL HEALTH EXAM:  There were no vitals taken for this visit.There is no height or weight on file to calculate BMI.  Patient declines including for coronavirus pandemic  General Appearance: Casual, Meticulous and Well Groomed  Eye Contact:  Good  Speech:  Clear and Coherent, Normal Rate, Pressured and Talkative  Volume:  Normal  Mood: Anxious and euthymic  Affect:  Congruent, Inappropriate, Labile, Full Range and Anxious  Thought Process:  Coherent, Goal Directed, Irrelevant, Linear and Descriptions of Associations: Circumstantial  Orientation:  Full (Time, Place, and Person)  Thought Content: Ilusions, Obsessions and Rumination   Suicidal Thoughts:  No  Homicidal Thoughts:  No  Memory:  Immediate;   Good Remote;   Good  Judgement:  Good  Insight:  Fair  Psychomotor Activity:  Normal, Increased and Mannerisms  Concentration:  Concentration: Good and Attention Span: Poor  Recall:  Good  Fund of Knowledge: Good  Language: Good  Assets:  Desire for Improvement Leisure Time Social Support Vocational/Educational  ADL's:  Intact  Cognition: WNL  Prognosis:  Good    DIAGNOSES:    ICD-10-CM   1. Attention deficit hyperactivity disorder, combined type, moderate  F90.2 amphetamine-dextroamphetamine (ADDERALL XR) 25 MG 24 hr capsule     amphetamine-dextroamphetamine (ADDERALL XR) 25 MG 24 hr capsule    amphetamine-dextroamphetamine (ADDERALL XR) 25 MG 24 hr capsule  2. GAD (generalized anxiety disorder)  F41.1     Receiving Psychotherapy: No    RECOMMENDATIONS: Over 50% of the time is spent in counseling and coordination of care integrating cognitive behavioral therapy of the past with Arvil Chaco, LCSW with husband's current therapy in which she is integral for applying what he learns for symptom treatment matching including with medication here.  Antianxiety medication has not been necessary patient hesitant to acknowledge that she is anxious and her vigilant prevention and protection for family.  We process medication pharmacodynamics relative to stimulant treatment currently underway as she again considers that she could start some reduction in time, possibly by skipping doses on days not needed unless she reacts as did son in his past rebound when not on Vyvanse.  Adderall 25 mg XR mornings after breakfast is sent as #30 each for September 17th, October 17, and [facility] 16 [facility] [address].  She returns for follow-up in 3 months or sooner if needed.   Delight Hoh, MD

## 2019-02-04 IMAGING — DX DG FOOT COMPLETE 3+V*L*
3 series · 3 of 3 positions shown · non-contrast
Comparison: None.

CLINICAL DATA: Pain and bruising of the left foot.

EXAM:
LEFT FOOT - COMPLETE 3+ VIEW

[foot ap]
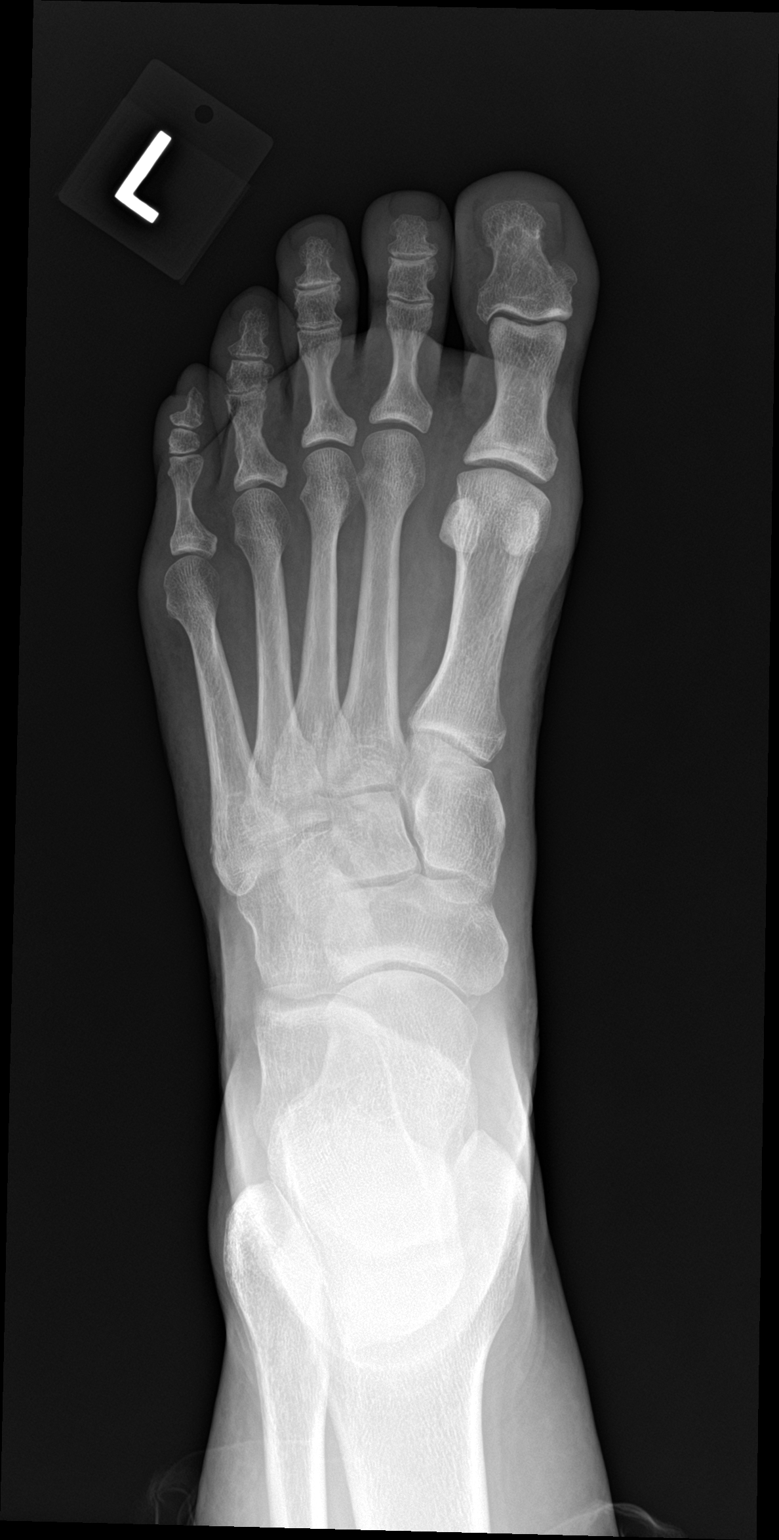

[foot obl]
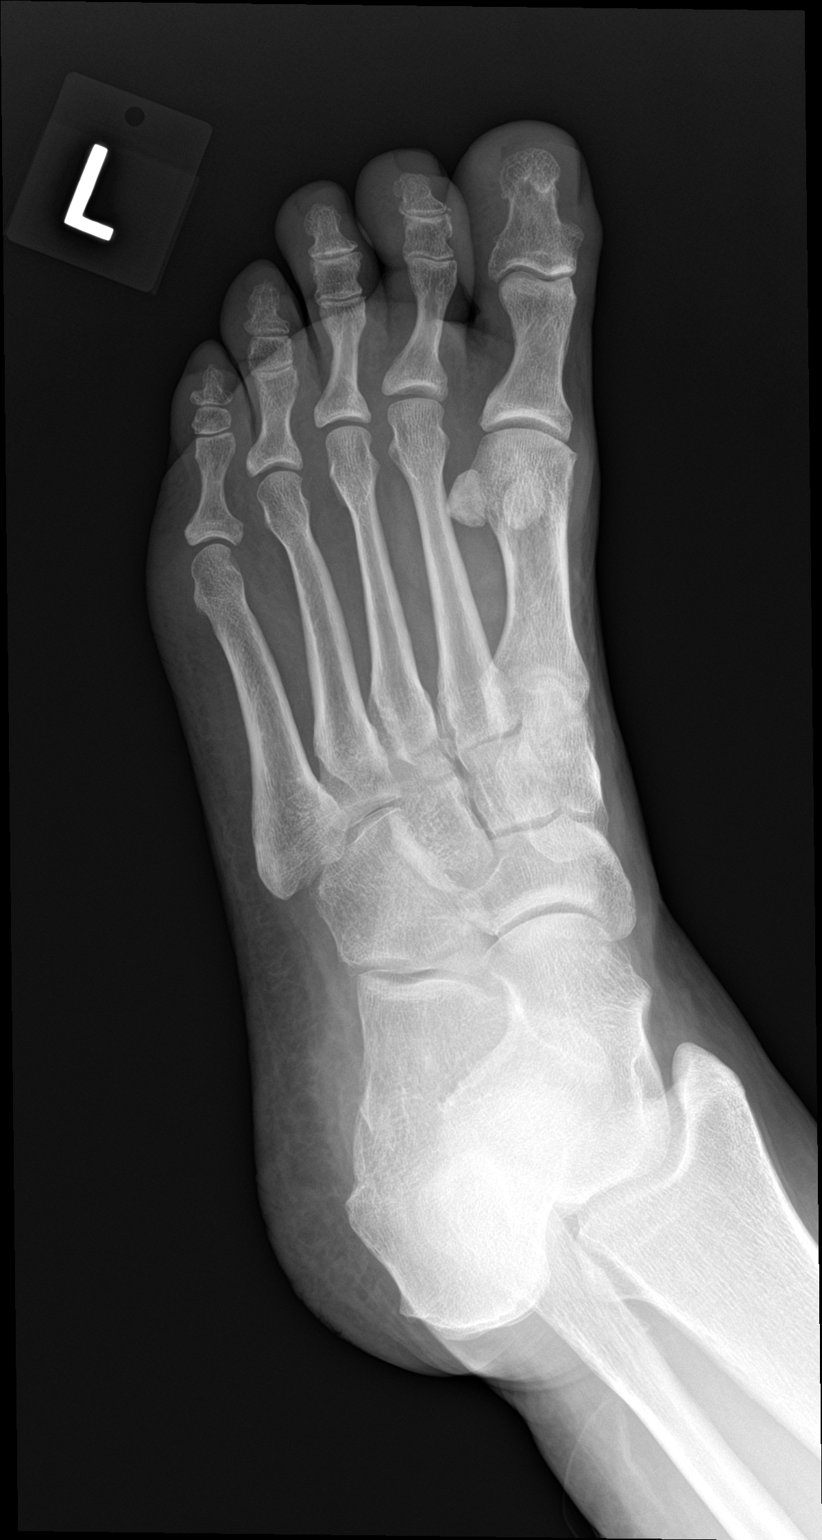

[foot lat]
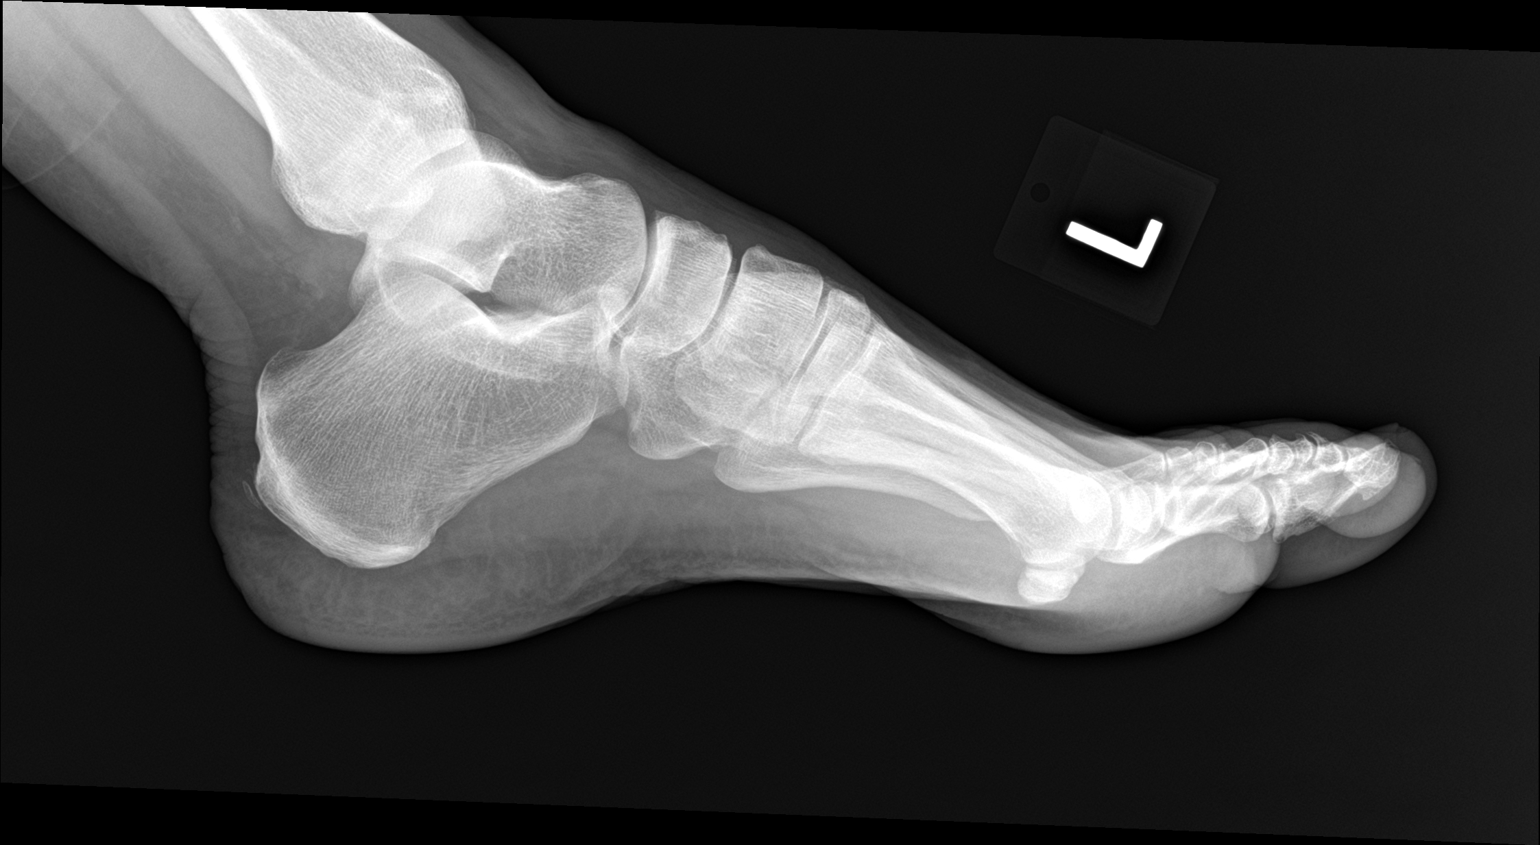

[3 of 3 positions shown; findings below may reference images not displayed]

FINDINGS: There is no evidence of fracture or dislocation. There is no
evidence of arthropathy or other focal bone abnormality. Soft
tissues are unremarkable.
IMPRESSION: Negative.

## 2019-02-06 ENCOUNTER — Telehealth: Payer: Self-pay | Admitting: Internal Medicine

## 2019-02-06 NOTE — Telephone Encounter (Signed)
Call returned to patient, she is wanting clarification of covid testing. She states she saw her friend Saturday. Her friend found out her co-workers daughter tested positive for covid. Both the child's mother, the patients friend, and the patient does not have symptoms. She wants to know whether she should be tested for covid and if it turns out negative should she be re-tested in a couple weeks. She afraid that if she is not tested she will give it to her husband or someone else. She is also concerned that it may be too early to test. She states this friend is like family and they were in close proximity over the whole weekend.   CY please advise, should patient get covid test after being in contact with a friend who's co-workers's daughter tested positive for Covid. Pt is NOT having any symptoms at this time.

## 2019-02-06 NOTE — Telephone Encounter (Signed)
LMOMTCB x 1 

## 2019-02-06 NOTE — Telephone Encounter (Signed)
Risk sounds low that our patient has been infected. For peace of mind, Elizabeth Pratt absolutely can go get herself tested. Elizabeth Pratt can go back in a couple of weeks or if symptomatic, to be re-tested.

## 2019-02-07 NOTE — Telephone Encounter (Signed)
Spoke with pt. She is aware of CY's response. Pt does not wish to be tested at this time. Nothing further was needed.

## 2019-03-07 ENCOUNTER — Ambulatory Visit (INDEPENDENT_AMBULATORY_CARE_PROVIDER_SITE_OTHER): Payer: 59 | Admitting: Psychiatry

## 2019-03-07 ENCOUNTER — Other Ambulatory Visit: Payer: Self-pay

## 2019-03-07 ENCOUNTER — Encounter: Payer: Self-pay | Admitting: Psychiatry

## 2019-03-07 DIAGNOSIS — F411 Generalized anxiety disorder: Secondary | ICD-10-CM | POA: Diagnosis not present

## 2019-03-07 DIAGNOSIS — F902 Attention-deficit hyperactivity disorder, combined type: Secondary | ICD-10-CM

## 2019-03-07 MED ORDER — AMPHETAMINE-DEXTROAMPHET ER 25 MG PO CP24: 25.0000 mg | ORAL_CAPSULE | Freq: Every day | ORAL | 0 refills | Status: DC

## 2019-03-07 NOTE — Progress Notes (Signed)
[facility]  Patient ID: Elizabeth Pratt,  MRN: [id number]  PCP: Burnis Medin, MD  Date of Evaluation: 03/07/2019 Time spent:20 minutes from 1300 to 1320  Chief Complaint:  Chief Complaint    ADHD; Anxiety      HISTORY/CURRENT STATUS: Elizabeth Pratt is seen in office onsite 20 minutes face-to-face individually with consent with epic collateral for psychiatric interview and exam and 62-month evaluation and management of ADHD and generalized anxiety.  Patient has required quarterly medication review and update as well as integrated psychotherapy as her therapist at [facility] retired for illness and her husband has had illness while the 2 children still depend on her despite living away.  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  She must process in detail today her husband turning 46 years expecting to die as did his father at age 47 going to the emergency department in the process despite reassurance from the patient's guidance and help.  The husband then went to visit his mother after turning 13 becoming reminded of the conflict and hostility of the mother for his problems rather than being supportive so she is in denial.  The family was further negatively impacted when the husband's mother was open and communicative with the granddaughter even to the point of acknowledging her denial for the father.  Patient refuses her weight to be measured again as usual.  [place] registry documents 02/23/2019 last fill for Adderall.  She is now appropriate in her use of the Adderall though our tapering of the dose was unsuccessful as she returned to the current 25 mg dose with no adverse effects but attempting to assign problem-solving to the therapy rather than the medication.  Sleep remains difficult but not due to the medication therefore taking the Adderall  quite regularly.  She has no mania, suicidality, psychosis or delirium.  Anxiety         Presents forfollow-upvisit. Symptoms includedecreased concentration,excessive worry, inhibition, repetitive reaction formation,muscle tension, insomnia,nervous/anxious behaviorand restlessness. Patient reports noconfusion, avoidance,irritability, depressed mood,panic,or suicidal ideas. Symptoms occurevery day, addressing modulation and self-directed recovery. The quality of sleep isgood. Nighttime awakenings:occasional. Side effects of treatment includejoint pain.She is medication compliant 76 to 100% of the time, but the integration of actual and station anxiety and to effective coping will require facilitation of the Adderall to become more intermittent for or successful generalization and reinforcement.  Individual Medical History/ Review of Systems: Changes? :No   Allergies: Demerol [meperidine], Dilaudid [hydromorphone hcl], Meloxicam, Penicillins, and Sulfonamide derivatives  Current Medications:  Current Outpatient Medications:  .  albuterol (PROVENTIL HFA;VENTOLIN HFA) 108 (90 Base) MCG/ACT inhaler, Inhale 2 puffs into the lungs every 6 (six) hours as needed for wheezing or shortness of breath., Disp: 3 Inhaler, Rfl: 3 .  albuterol (PROVENTIL) (2.5 MG/3ML) 0.083% nebulizer solution, Take 3 mLs (2.5 mg total) by nebulization every 6 (six) hours as needed for wheezing or shortness of breath., Disp: 75 mL, Rfl: 12 .  [START ON 03/25/2019] amphetamine-dextroamphetamine (ADDERALL XR) 25 MG 24 hr capsule, Take 1 capsule by mouth daily after breakfast., Disp: 30 capsule, Rfl: 0 .  [START ON 04/24/2019] amphetamine-dextroamphetamine (ADDERALL XR) 25 MG 24 hr capsule, Take 1 capsule by mouth daily after breakfast., Disp: 30 capsule, Rfl: 0 .  [START ON 05/24/2019] amphetamine-dextroamphetamine (ADDERALL XR) 25 MG 24 hr capsule, Take 1 capsule by mouth daily after  breakfast., Disp: 30 capsule, Rfl:  0 .  cetirizine (ZYRTEC) 10 MG tablet, Take 10 mg by mouth daily., Disp: , Rfl:  .  fluticasone (FLONASE) 50 MCG/ACT nasal spray, 1-2 puffs each nostril once daily, Disp: 48 g, Rfl: 3 .  fluticasone furoate-vilanterol (BREO ELLIPTA) 100-25 MCG/INH AEPB, Inhale 1 puff into the lungs daily., Disp: 28 each, Rfl: 1 .  fluticasone furoate-vilanterol (BREO ELLIPTA) 100-25 MCG/INH AEPB, Inhale 1 puff then rinse mouth, once daily, Disp: 180 each, Rfl: 3 .  triamcinolone cream (KENALOG) 0.1 %, Apply 1 application topically 2 (two) times daily., Disp: 80 g, Rfl: 1  Medication Side Effects: none  Family Medical/ Social History: Changes? Yes longstanding family object relations conflicts with acute exacerbations.  MENTAL HEALTH EXAM:  There were no vitals taken for this visit.There is no height or weight on file to calculate BMI. Muscle strengths and tone 5/5, postural reflexes and gait 0/0, and AIMS = 0.  Patient refused having her weight and otherwise deferred for coronavirus shutdown  General Appearance: Casual, Fairly Groomed, Guarded and Meticulous  Eye Contact:  Good  Speech:  Clear and Coherent, Normal Rate, Pressured and Talkative  Volume:  Normal to increased  Mood:  Anxious, Dysphoric and Euthymic  Affect:  Congruent, Constricted, Inappropriate, Labile, Full Range and Anxious  Thought Process:  Coherent, Goal Directed, Irrelevant, Linear and Descriptions of Associations: Circumstantial  Orientation:  Full (Time, Place, and Person)  Thought Content: Ilusions, Obsessions and Rumination   Suicidal Thoughts:  No  Homicidal Thoughts:  No  Memory:  Immediate;   Good Remote;   Good  Judgement:  Fair  Insight:  Good  Psychomotor Activity:  Increased, Mannerisms and Restlessness  Concentration:  Concentration: Fair and Attention Span: Poor  Recall:  Good  Fund of Knowledge: Good  Language: Good  Assets:  Desire for Improvement Leisure Time Resilience Talents/Skills  ADL's:  Intact   Cognition: WNL  Prognosis:  Good    DIAGNOSES:    ICD-10-CM   1. Attention deficit hyperactivity disorder, combined type, moderate  F90.2 amphetamine-dextroamphetamine (ADDERALL XR) 25 MG 24 hr capsule    amphetamine-dextroamphetamine (ADDERALL XR) 25 MG 24 hr capsule    amphetamine-dextroamphetamine (ADDERALL XR) 25 MG 24 hr capsule  2. GAD (generalized anxiety disorder)  F41.1     Receiving Psychotherapy: No except here   RECOMMENDATIONS: Over 50% of the 20-minute face-to-face time is spent in a total of 10 minutes of counseling and coordination of care with particular cognitive behavioral exposure thought stopping desensitization habit reversal response prevention behavioral nutrition, sleep hygiene, object relations, and frustration management interventions.  Preventative treatment monitoring and safety hygiene are updated in psychoeducation and support.  She is E scribed Adderall 25 mg ER daily sent as #30 each for December 19, January 18, and February 17 [facility] [address] [facility].  Though BuSpar and Zoloft can be considered, the patient does not tolerate assigning medication for anxiety when husband has had so much problems with his medication for such.  Returns for follow-up in 3 months.  Delight Hoh, MD

## 2019-06-16 ENCOUNTER — Other Ambulatory Visit: Payer: Self-pay | Admitting: Internal Medicine

## 2019-06-19 ENCOUNTER — Other Ambulatory Visit: Payer: Self-pay

## 2019-06-19 ENCOUNTER — Encounter: Payer: Self-pay | Admitting: Psychiatry

## 2019-06-19 ENCOUNTER — Ambulatory Visit (INDEPENDENT_AMBULATORY_CARE_PROVIDER_SITE_OTHER): Payer: 59 | Admitting: Psychiatry

## 2019-06-19 DIAGNOSIS — F902 Attention-deficit hyperactivity disorder, combined type: Secondary | ICD-10-CM

## 2019-06-19 DIAGNOSIS — F411 Generalized anxiety disorder: Secondary | ICD-10-CM | POA: Diagnosis not present

## 2019-06-19 MED ORDER — AMPHETAMINE-DEXTROAMPHET ER 25 MG PO CP24: 25.0000 mg | ORAL_CAPSULE | Freq: Every day | ORAL | 0 refills | Status: DC

## 2019-06-19 NOTE — Progress Notes (Signed)
[facility]  Patient ID: Elizabeth Pratt,  MRN: [id number]  PCP: Burnis Medin, MD  Date of Evaluation: 06/19/2019 Time spent:25 minutes  From 1000 to 1025 Chief Complaint:  Chief Complaint    ADHD; Anxiety      HISTORY/CURRENT STATUS: Elizabeth Pratt is seen onsite in office 25 minutes face-to-face individually with consent with epic collateral for psychiatric interview and exam in 65-month evaluation and management of ADHD and generalized anxiety.  The patient has struggled session to session about conflicts for her husband and children that she needs to solve to relieve her own anxiety and optimize her focus upon the productive side of making their lives more rewarding and enriched rather than just supporting them getting through the problem times.  Husband is taking Xanax as needed but has not resumed any antianxiety antidepressant, and she declines the same for herself.  Her anxiety is less consequential today so she can accept that she cannot resolve his repetition compulsion to neurotic conflict from his childhood unless he becomes willing to do such himself as well.  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  She is supported by her son whereas her daughter verbalizes the same conflicts as husband.  Husband maintains that he is at the end of his useful life whether dealing with being a white female in [place] in these times or whether dealing with his childhood.  Husband is going back to the gym and following his diet. We process continuing Adderall currently 25 mg XR as her best dose over time, and there is no need today to suggest the BuSpar or Zoloft as considered in the past.  Her appointments every 3 months including with egoanalytic review of conflicts she must face in life has facilitated stabilization of the Adderall dose 25 mg without change. Such stabillization facilitates completion of the session today able  to extend her appointments to every 6 months from every 3 months.  She has no mania, suicidality, psychosis or delirium.  Anxiety  Elizabeth Pratt presents forfollow-upvisit. Symptoms includedecreased concentration, worry, inhibition, reaction formation,and restlessness. Patient reports noconfusion, muscle tension, insomnia,nervous/anxious behavior, avoidance,irritability,depressed mood,panic,or suicidal ideas. Symptoms occur a couple of times weekly but are contained by addressingmodulation and self-directed recovery.The quality of sleep isgood. Nighttime awakenings:occasional. Side effects of treatment includejoint pain.She is medication compliant 76 to 100% of the time as effective coping facilitates successful generalization and reinforcement.  Individual Medical History/ Review of Systems: Changes? :Yes with left upper extremity nerve compression syndrome at the elbow and wrist, no longer offering to check weight as always refused.  Allergies: Demerol [meperidine], Dilaudid [hydromorphone hcl], Meloxicam, Penicillins, and Sulfonamide derivatives  Current Medications:  Current Outpatient Medications:  .  albuterol (PROVENTIL HFA;VENTOLIN HFA) 108 (90 Base) MCG/ACT inhaler, Inhale 2 puffs into the lungs every 6 (six) hours as needed for wheezing or shortness of breath., Disp: 3 Inhaler, Rfl: 3 .  albuterol (PROVENTIL) (2.5 MG/3ML) 0.083% nebulizer solution, Take 3 mLs (2.5 mg total) by nebulization every 6 (six) hours as needed for wheezing or shortness of breath., Disp: 75 mL, Rfl: 12 .  amphetamine-dextroamphetamine (ADDERALL XR) 25 MG 24 hr capsule, Take 1 capsule by mouth daily after breakfast., Disp: 30 capsule, Rfl: 0 .  amphetamine-dextroamphetamine (ADDERALL XR) 25 MG 24 hr capsule, Take 1 capsule by mouth daily after breakfast., Disp: 30 capsule, Rfl: 0 .  amphetamine-dextroamphetamine (ADDERALL XR) 25 MG 24 hr capsule, Take  1 capsule by mouth daily after breakfast., Disp: 30  capsule, Rfl: 0 .  cetirizine (ZYRTEC) 10 MG tablet, Take 10 mg by mouth daily., Disp: , Rfl:  .  fluticasone (FLONASE) 50 MCG/ACT nasal spray, 1-2 puffs each nostril once daily, Disp: 48 g, Rfl: 3 .  fluticasone furoate-vilanterol (BREO ELLIPTA) 100-25 MCG/INH AEPB, Inhale 1 puff into the lungs daily., Disp: 28 each, Rfl: 1 .  fluticasone furoate-vilanterol (BREO ELLIPTA) 100-25 MCG/INH AEPB, Inhale 1 puff then rinse mouth, once daily, Disp: 180 each, Rfl: 3 .  triamcinolone cream (KENALOG) 0.1 %, Apply 1 application topically 2 (two) times daily., Disp: 80 g, Rfl: 1  Medication Side Effects: none  Family Medical/ Social History: Changes? No  MENTAL HEALTH EXAM:  There were no vitals taken for this visit.There is no height or weight on file to calculate BMI. Muscle strengths and tone 5/5, postural reflexes and gait 0/0, and AIMS = 0.  General Appearance: Casual, Meticulous and Well Groomed  Eye Contact:  Good  Speech:  Clear and Coherent, Normal Rate and Talkative  Volume:  Normal  Mood:  Euphoric  Affect:  Full Range  Thought Process:  Coherent, Goal Directed and Descriptions of Associations: Circumstantial  Orientation:  Full (Time, Place, and Person)  Thought Content: Rumination   Suicidal Thoughts:  No  Homicidal Thoughts:  No  Memory:  Immediate;   Good Remote;   Good  Judgement:  Good  Insight:  Good  Psychomotor Activity:  Normal and Mannerisms  Concentration:  Concentration: Good and Attention Span: Fair  Recall:  Good  Fund of Knowledge: Good  Language: Good  Assets:  Desire for Improvement Resilience Talents/Skills  ADL's:  Intact  Cognition: WNL  Prognosis:  Good    DIAGNOSES:    ICD-10-CM   1. Attention deficit hyperactivity disorder, combined type, moderate  F90.2   2. 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  Over 50% of the 20-minute face-to-face time is spent in 10-minute total counseling and coordination of care in this regard with patient concluding she will extend the interval tween appointments to 6 months from 3 months.  She is E scribed Adderall 25 mg XR every morning sent as #30 each for March 19, April 18, and May 18 [facility] [address] density registry documenting last dispensing 05/24/2019.  She returns for follow-up in 6 months or sooner if needed understanding how to obtain Adderall here when supply low in the interim.   Delight Hoh, MD

## 2019-07-19 ENCOUNTER — Ambulatory Visit (INDEPENDENT_AMBULATORY_CARE_PROVIDER_SITE_OTHER): Payer: 59

## 2019-07-19 ENCOUNTER — Ambulatory Visit (INDEPENDENT_AMBULATORY_CARE_PROVIDER_SITE_OTHER): Payer: 59 | Admitting: Internal Medicine

## 2019-07-19 ENCOUNTER — Other Ambulatory Visit: Payer: Self-pay

## 2019-07-19 ENCOUNTER — Encounter: Payer: Self-pay | Admitting: Internal Medicine

## 2019-07-19 VITALS — BP 138/80 | HR 66 | Temp 97.7°F | Ht 67.0 in | Wt 185.0 lb

## 2019-07-19 DIAGNOSIS — J45909 Unspecified asthma, uncomplicated: Secondary | ICD-10-CM

## 2019-07-19 DIAGNOSIS — F17200 Nicotine dependence, unspecified, uncomplicated: Secondary | ICD-10-CM | POA: Diagnosis not present

## 2019-07-19 MED ORDER — FLUTICASONE PROPIONATE 50 MCG/ACT NA SUSP: NASAL | 3 refills | Status: AC

## 2019-07-19 MED ORDER — ALBUTEROL SULFATE HFA 108 (90 BASE) MCG/ACT IN AERS: 2.0000 | INHALATION_SPRAY | Freq: Four times a day (QID) | RESPIRATORY_TRACT | 12 refills | Status: DC | PRN

## 2019-07-19 NOTE — Assessment & Plan Note (Signed)
Denies any craving for tobacco. Plan-- update CXR

## 2019-07-19 NOTE — Progress Notes (Signed)
Patient ID: Elizabeth Pratt, female    DOB: [birth year], 59 y.o.   MRN: [id number]  HPI Female former smoker followed for asthma, bronchitis, eustachian dysfunction PFT 04/30/08- FEV1 2.79/99%, FEV1/FVC 0.75, significant response to bronchodilator, TLC 94%, DLCO 109%.  -----------------------------------------------------------------------------   02/02/2018- 59 year old female former smoker followed for asthma, bronchitis, eustachian dysfunction -----Follows for: asthma, bronchitis, allergic reaction to perfume which started a flare up, coughed more when she traveled to [facility], mucus was clear at first but now it is thick/green mucus, has not been using the [place], felt she didnt need it albuterol HFA, (Dulera 100/Breo 100) Says she had done well this year, traveling in the [place].  Has not needed maintenance inhaler much at all.  Distantly was exposed to strong cologne in a hotel lobby and had a lot of coughing, then bothered by weather changes and temperature changes going in and out of air conditioning.  Mucus had been clear until today when she began having increased sinus drainage.  We discussed use of Sudafed for nasal congestion. Has not smoked in 4 years.  07/19/19- 59 year old female former smoker followed for asthma, bronchitis, eustachian dysfunction, Allergic Rhinitis, complicated by ADHD, Anxiety,  albuterol HFA, Breo 100, neb albuterol,  Feeling well. Had 2 Covax Phizer. Using [place] daily, keeps rescue available but not usually needed. No exacerbations. Has had several hand surgeries. Notes eczema on arms, esp after dog contact or gardening gloves.  Discussed topical moisturizers. Has topical steroid.   ROS-see HPI   + = positive Constitutional:   No-   weight loss, night sweats, fevers, chills, fatigue, lassitude. HEENT:   No-  headaches, difficulty swallowing, tooth/dental problems, sore throat,       No-  sneezing, itching, ear ache, +  nasal congestion,+ post nasal drip,  CV:   No-   chest pain, orthopnea, PND, swelling in lower extremities, anasarca, dizziness, palpitations Resp: shortness of breath with  + exertion not at rest.          No-productive cough,  + non-productive cough,  No- coughing up of blood.              No-   change in color of mucus.  No-wheezing.   Skin: + eczema GI:  No-   heartburn, indigestion, abdominal pain, nausea, vomiting,  GU: , MS:  No-   joint pain or swelling.  . Neuro-     nothing unusual Psych:  No- change in mood or affect. No depression or anxiety.  No memory loss.  Objective:  OBJ- Physical Exam- unremarkable General- Alert, Oriented, Affect-appropriate, Distress- none acute, +overweight Skin- rash-none, lesions- none, excoriation- none Lymphadenopathy- none Head- atraumatic            Eyes- Gross vision intact, PERRLA, conjunctivae and secretions clear, + mild horizontal nystagmus            Ears- Hearing, canals-normal,             Nose-  no-Septal dev, mucus+, no-polyps, erosion, perforation             Throat- Mallampati II , mucosa clear , drainage- none, tonsils- atrophic Neck- flexible , trachea midline, no stridor , thyroid nl, carotid no bruit Chest - symmetrical excursion , unlabored           Heart/CV- RRR , no murmur , no gallop  , no rub, nl s1 s2                           -  JVD- none , edema- none, stasis changes- none, varices- none           Lung- clear, cough-none , dullness-none, rub- none           Chest wall-  Abd- Br/ Gen/ Rectal- Not done, not indicated Extrem- cyanosis- none, clubbing, none, atrophy- none, strength- nl Neuro- grossly intact to observation

## 2019-07-19 NOTE — Patient Instructions (Signed)
Order- CXR   Dx asthmatic bronchitis  Script sent refilling albuterol rescue inhaler  Please call if we can help

## 2019-07-19 NOTE — Assessment & Plan Note (Signed)
Mild intermittent uncomplicated. Plan- refill rescue inhaler

## 2019-07-24 NOTE — Progress Notes (Signed)
Patient identification verified. Results of recent CXR reviewed. Per Dr. Annamaria Boots, chest x ray shows mild chronic bronchitis, otherwise unremarkable. Patient verbalized understanding of results.

## 2019-09-19 ENCOUNTER — Other Ambulatory Visit: Payer: Self-pay

## 2019-09-19 ENCOUNTER — Telehealth: Payer: Self-pay | Admitting: Psychiatry

## 2019-09-19 DIAGNOSIS — F902 Attention-deficit hyperactivity disorder, combined type: Secondary | ICD-10-CM

## 2019-09-19 MED ORDER — AMPHETAMINE-DEXTROAMPHET ER 25 MG PO CP24: 25.0000 mg | ORAL_CAPSULE | Freq: Every day | ORAL | 0 refills | Status: DC

## 2019-09-19 NOTE — Telephone Encounter (Signed)
Pt will need next 3 RF of Adderall XR 25mg  to [facility] [place] [place]. When does she need to RS follow up appt?

## 2019-10-20 ENCOUNTER — Telehealth: Payer: Self-pay | Admitting: Psychiatry

## 2019-10-20 ENCOUNTER — Other Ambulatory Visit: Payer: Self-pay

## 2019-10-20 DIAGNOSIS — F902 Attention-deficit hyperactivity disorder, combined type: Secondary | ICD-10-CM

## 2019-10-20 MED ORDER — AMPHETAMINE-DEXTROAMPHET ER 25 MG PO CP24: 25.0000 mg | ORAL_CAPSULE | Freq: Every day | ORAL | 0 refills | Status: DC

## 2019-10-20 NOTE — Telephone Encounter (Signed)
ADHD treatment in adult appropriate also for last [place] registry dispensing 09/20/2019 and in the last year for Adderall use and dispensing sent as 25 mg XR every morning [facility] [address] medically necessary no contraindication

## 2019-10-20 NOTE — Telephone Encounter (Signed)
Last refill 09/20/2019 Pended for Dr. Creig Hines to review and send

## 2019-10-20 NOTE — Telephone Encounter (Signed)
Pt requesting refill for Adderall XR 25 mg @ [facility] [address]. Apt due Sept

## 2019-11-13 LAB — HM PAP SMEAR

## 2019-11-22 ENCOUNTER — Other Ambulatory Visit: Payer: Self-pay

## 2019-11-22 ENCOUNTER — Telehealth: Payer: Self-pay | Admitting: Psychiatry

## 2019-11-22 DIAGNOSIS — F902 Attention-deficit hyperactivity disorder, combined type: Secondary | ICD-10-CM

## 2019-11-22 MED ORDER — AMPHETAMINE-DEXTROAMPHET ER 25 MG PO CP24: 25.0000 mg | ORAL_CAPSULE | Freq: Every day | ORAL | 0 refills | Status: DC

## 2019-11-22 NOTE — Telephone Encounter (Signed)
Last refill 10/20/2019 Pended for Dr. Creig Hines to send

## 2019-11-22 NOTE — Telephone Encounter (Signed)
Pt called, has an appt 9/13 and needs refill for Adderall. Call [facility] [place], [place], [place]

## 2019-12-18 ENCOUNTER — Other Ambulatory Visit: Payer: Self-pay

## 2019-12-18 ENCOUNTER — Encounter: Payer: Self-pay | Admitting: Psychiatry

## 2019-12-18 ENCOUNTER — Ambulatory Visit (INDEPENDENT_AMBULATORY_CARE_PROVIDER_SITE_OTHER): Payer: 59 | Admitting: Psychiatry

## 2019-12-18 DIAGNOSIS — F411 Generalized anxiety disorder: Secondary | ICD-10-CM | POA: Diagnosis not present

## 2019-12-18 DIAGNOSIS — F902 Attention-deficit hyperactivity disorder, combined type: Secondary | ICD-10-CM

## 2019-12-18 MED ORDER — AMPHETAMINE-DEXTROAMPHET ER 25 MG PO CP24: 25.0000 mg | ORAL_CAPSULE | Freq: Every day | ORAL | 0 refills | Status: DC

## 2019-12-18 NOTE — Progress Notes (Signed)
[facility]  Patient ID: Elizabeth Pratt,  MRN: [id number]  PCP: Burnis Medin, MD  Date of Evaluation: 12/18/2019 Time spent:20 minutes  From 1025 to 1045  Chief Complaint:  Chief Complaint    ADHD; Anxiety      HISTORY/CURRENT STATUS: Elizabeth Pratt is seen onsite in office 20 minutes face-to-face individually with consent with epic collateral for psychiatric interview and exam in 20-month evaluation and management of ADHD and generalized anxiety.  Interval since last session review includes psychotherapeutic intervention for anxiety of last  3 to 6 months as now upon this current appointment.  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  Daughter will graduate in December and likely move to [place] from [place], while son is in the process of moving to [place] from [place] with the help of the family after break-up with his girlfriend there.  Patient finds Adderall facilitates executive function managing responsibilities adequately taking it daily without side effects or misuse.  [place] registry documents last fill of Adderall dispensed 11/22/2019 and appropriate for the last year and longer in review.  Patient reviews husband's difficulty with medication management in this office now refusing any medications himself.  Patient's therapist reitred for illness, and we now establish closure of my retirement due to age today as she addresses continuing care herself in this office preferably.  She decompresses difficulties with mental health system for husband, even as daughter is graduating into public administration of health services.  Though BuSpar and Zoloft had been considered in addition to Adderall in the past, the patient copes best with her own self-help and psychotherapeutic skills from previous therapy in facilitating taking care of family to apply to her own stressors and  daily symptom management.  She has no mania, suicidality, psychosis or delirium.  Anxiety  Elizabeth Pratt presents forfollow-upvisit. Symptoms includedecreased concentration, worry, overthinking, reaction formation,and restlessness. Patient reports noconfusion, avoidance,irritability, muscle tension,insomnia,inhibition, nervous/anxious behavior,depressed mood,panic,or suicidal ideas. Symptoms occur a couple of times weekly but are contained by addressingmodulation and self-directed recovery.The quality of sleep isgood. Nighttime awakenings:occasional. Side effects of treatment includejoint pain.She is medication compliant 76 to 100% of the time as effective coping facilitates successful generalization and reinforcement.  Individual Medical History/ Review of Systems: Changes? :Yes medical appointment for asthmatic bronchitis April 14 presents no concern or contraindication for Adderall.  Allergies: Demerol [meperidine], Dilaudid [hydromorphone hcl], Meloxicam, Penicillins, Sulfonamide derivatives, and Meperidine hcl  Current Medications:  Current Outpatient Medications:  .  albuterol (PROVENTIL) (2.5 MG/3ML) 0.083% nebulizer solution, Take 3 mLs (2.5 mg total) by nebulization every 6 (six) hours as needed for wheezing or shortness of breath., Disp: 75 mL, Rfl: 12 .  albuterol (VENTOLIN HFA) 108 (90 Base) MCG/ACT inhaler, Inhale 2 puffs into the lungs every 6 (six) hours as needed for wheezing or shortness of breath., Disp: 18 g, Rfl: 12 .  [START ON 12/22/2019] amphetamine-dextroamphetamine (ADDERALL XR) 25 MG 24 hr capsule, Take 1 capsule by mouth daily after breakfast., Disp: 30 capsule, Rfl: 0 .  [START ON 01/21/2020] amphetamine-dextroamphetamine (ADDERALL XR) 25 MG 24 hr capsule, Take 1 capsule by mouth daily after breakfast., Disp: 30 capsule, Rfl: 0 .  [START ON 02/20/2020] amphetamine-dextroamphetamine (ADDERALL XR) 25 MG 24 hr capsule, Take 1 capsule by mouth daily after  breakfast., Disp: 30 capsule, Rfl: 0 .  cetirizine (ZYRTEC) 10 MG tablet, Take 10 mg by mouth daily., Disp: , Rfl:  .  fluticasone (FLONASE) 50 MCG/ACT nasal spray, 1-2 puffs each nostril once daily, Disp: 48 g, Rfl: 3 .  fluticasone furoate-vilanterol (BREO ELLIPTA) 100-25 MCG/INH AEPB, Inhale 1 puff then rinse mouth, once daily, Disp: 180 each, Rfl: 3 .  triamcinolone cream (KENALOG) 0.1 %, Apply 1 application topically 2 (two) times daily., Disp: 80 g, Rfl: 1  Medication Side Effects: none  Family Medical/ Social History: Changes? No  MENTAL HEALTH EXAM:  There were no vitals taken for this visit.There is no height or weight on file to calculate BMI. Muscle strengths and tone 5/5, postural reflexes and gait 0/0, and AIMS = 0.  General Appearance: Casual, Meticulous and Well Groomed  Eye Contact:  Good  Speech:  Clear and Coherent, Normal Rate and Talkative  Volume:  Normal  Mood:  Anxious and Euthymic  Affect:  Full Range and Anxious  Thought Process:  Coherent, Goal Directed and Descriptions of Associations: Circumstantial  Orientation:  Full (Time, Place, and Person)  Thought Content: Obsessions and Rumination   Suicidal Thoughts:  No  Homicidal Thoughts:  No  Memory:  Immediate;   Good Remote;   Good  Judgement:  Good  Insight:  Good  Psychomotor Activity:  Normal and Mannerisms  Concentration:  Concentration: Good and Attention Span: Fair  Recall:  Good  Fund of Knowledge: Good  Language: Good  Assets:  Desire for Improvement Intimacy Resilience Talents/Skills  ADL's:  Intact  Cognition: WNL  Prognosis:  Good    DIAGNOSES:    ICD-10-CM   1. Attention deficit hyperactivity disorder, combined type, moderate  F90.2 amphetamine-dextroamphetamine (ADDERALL XR) 25 MG 24 hr capsule    amphetamine-dextroamphetamine (ADDERALL XR) 25 MG 24 hr capsule    amphetamine-dextroamphetamine (ADDERALL XR) 25 MG 24 hr capsule  2. GAD (generalized anxiety disorder)  F41.1      Receiving Psychotherapy: No    RECOMMENDATIONS: Over 50% of the 20-minute face-to-face session is spent 10 minutes total counseling and coordination of care as patient takes no medication for anxiety but only her medication for ADHD such that cognitive behavioral therapy for social problem-solving and frustration management in family responsibilities coordinating the lives of both children and husband continues. Summary can be naturally integrated with symptom treatment matching for medication over time currently on the Adderall 25 mg XR she wishes and needs to continue.  Adderall is E scribed 25 mg XR every morning sent as #30 each for September 17, October 17, and November [facility] [address] for [facility].  Closure of care for my eminent retirement generalizes transition and transfer to advanced practitioner in the office for continued treatment of ADHD as patient finds the generalized anxiety no longer needing specific therapy previously with Arvil Chaco, LCSW at [facility] as long as she updates here every 6 months the clinical course of her cause and effect problem-solving more so for family than herself.  Follow-up will be in 6 months with advanced practitioner in the office appropriate to provide through the office those interim descriptions that cannot be sent ahead other than the 3 escriptions today.  Delight Hoh, MD

## 2020-01-25 ENCOUNTER — Encounter: Payer: Self-pay | Admitting: Psychiatry

## 2020-03-06 ENCOUNTER — Ambulatory Visit: Payer: 59 | Attending: Internal Medicine

## 2020-03-06 ENCOUNTER — Other Ambulatory Visit (HOSPITAL_COMMUNITY): Payer: Self-pay | Admitting: Internal Medicine

## 2020-03-06 DIAGNOSIS — Z23 Encounter for immunization: Secondary | ICD-10-CM

## 2020-03-06 NOTE — Progress Notes (Signed)
   Covid-19 Vaccination Clinic  Name:  Elizabeth Pratt    MRN: [id number] DOB: 1960/10/28  03/06/2020  [name] was observed post Covid-19 immunization for 15 minutes without incident. She was provided with Vaccine Information Sheet and instruction to access the V-Safe system.   [name] was instructed to call 911 with any severe reactions post vaccine: [name] Difficulty breathing  . Swelling of face and throat  . A fast heartbeat  . A bad rash all over body  . Dizziness and weakness   Immunizations Administered    Name Date Dose VIS Date Route   Pfizer COVID-19 Vaccine 03/06/2020 10:11 AM 0.3 mL 01/24/2020 Intramuscular   Manufacturer: [place]   Lot: [id number]   Graf: [id number]

## 2020-04-02 ENCOUNTER — Other Ambulatory Visit: Payer: Self-pay | Admitting: Internal Medicine

## 2020-06-17 ENCOUNTER — Ambulatory Visit: Payer: 59 | Admitting: Adult Health

## 2020-07-18 ENCOUNTER — Ambulatory Visit: Payer: 59 | Admitting: Internal Medicine

## 2020-07-29 ENCOUNTER — Other Ambulatory Visit: Payer: Self-pay | Admitting: Internal Medicine

## 2020-07-30 NOTE — Progress Notes (Signed)
Patient ID: Elizabeth Pratt, female    DOB: 05/06/1960, 60 y.o.   MRN: [id number]  HPI Female former smoker followed for asthma, bronchitis, eustachian dysfunction PFT 04/30/08- FEV1 2.79/99%, FEV1/FVC 0.75, significant response to bronchodilator, TLC 94%, DLCO 109%.  -----------------------------------------------------------------------------  07/19/19- 60 year old female former smoker followed for asthma, bronchitis, eustachian dysfunction, Allergic Rhinitis, complicated by ADHD, Anxiety,  albuterol HFA, Breo 100, neb albuterol,  Feeling well. Had 2 Covax Phizer. Using [place] daily, keeps rescue available but not usually needed. No exacerbations. Has had several hand surgeries. Notes eczema on arms, esp after dog contact or gardening gloves.  Discussed topical moisturizers. Has topical steroid.   07/31/20- 60 year old female former smoker followed for asthma, bronchitis, eustachian dysfunction, Allergic Rhinitis, complicated by ADHD, Anxiety,  -albuterol HFA, Breo 100, neb albuterol,  Covid vax-3 Phizer Flu vax-  no                           ?Need PFT? -----Would like to talk about inhalers to see if there is a generic she can use.  Concerned by cost of Breo although she can handle it. Discussed alternatives. No acute events. CXR 07/19/19-  IMPRESSION: Mild peribronchial cuffing.  No focal consolidation.   ROS-see HPI   + = positive Constitutional:   No-   weight loss, night sweats, fevers, chills, fatigue, lassitude. HEENT:   No-  headaches, difficulty swallowing, tooth/dental problems, sore throat,       No-  sneezing, itching, ear ache, +  nasal congestion,+ post nasal drip,  CV:  No-   chest pain, orthopnea, PND, swelling in lower extremities, anasarca, dizziness, palpitations Resp: shortness of breath with  + exertion not at rest.          No-productive cough,  + non-productive cough,  No- coughing up of blood.              No-   change in color of mucus.  No-wheezing.   Skin: +  eczema GI:  No-   heartburn, indigestion, abdominal pain, nausea, vomiting,  GU: , MS:  No-   joint pain or swelling.  . Neuro-     nothing unusual Psych:  No- change in mood or affect. No depression or anxiety.  No memory loss.  Objective:  OBJ- Physical Exam- unremarkable General- Alert, Oriented, Affect-appropriate, Distress- none acute, +overweight Skin- rash-none, lesions- none, excoriation- none Lymphadenopathy- none Head- atraumatic            Eyes- Gross vision intact, PERRLA, conjunctivae and secretions clear, + mild horizontal nystagmus            Ears- Hearing, canals-normal,             Nose-  no-Septal dev, mucus+, no-polyps, erosion, perforation             Throat- Mallampati II , mucosa clear , drainage- none, tonsils- atrophic Neck- flexible , trachea midline, no stridor , thyroid nl, carotid no bruit Chest - symmetrical excursion , unlabored           Heart/CV- RRR , no murmur , no gallop  , no rub, nl s1 s2                           - JVD- none , edema- none, stasis changes- none, varices- none           Lung- clear, cough-none , dullness-none, rub- none  Chest wall-  Abd- Br/ Gen/ Rectal- Not done, not indicated Extrem- cyanosis- none, clubbing, none, atrophy- none, strength- nl Neuro- grossly intact to observation

## 2020-07-31 ENCOUNTER — Other Ambulatory Visit: Payer: Self-pay

## 2020-07-31 ENCOUNTER — Encounter: Payer: Self-pay | Admitting: Internal Medicine

## 2020-07-31 ENCOUNTER — Ambulatory Visit (INDEPENDENT_AMBULATORY_CARE_PROVIDER_SITE_OTHER): Payer: 59 | Admitting: Internal Medicine

## 2020-07-31 DIAGNOSIS — J45909 Unspecified asthma, uncomplicated: Secondary | ICD-10-CM | POA: Diagnosis not present

## 2020-07-31 DIAGNOSIS — J3089 Other allergic rhinitis: Secondary | ICD-10-CM | POA: Diagnosis not present

## 2020-07-31 DIAGNOSIS — J302 Other seasonal allergic rhinitis: Secondary | ICD-10-CM

## 2020-07-31 NOTE — Patient Instructions (Signed)
You can ask your pharmacy or check with your insurance formulary to price compare:  Breo 100, Advair 250, Symbicort 160, Dulera 100, Bevespi  Please call as needed

## 2020-09-03 ENCOUNTER — Emergency Department (HOSPITAL_COMMUNITY)
Admission: EM | Admit: 2020-09-03 | Discharge: 2020-09-03 | Disposition: A | Discharge status: Home / Self Care | Payer: 59 | Attending: Emergency Medicine | Admitting: Emergency Medicine

## 2020-09-03 ENCOUNTER — Emergency Department (HOSPITAL_COMMUNITY): Payer: 59

## 2020-09-03 ENCOUNTER — Encounter (HOSPITAL_COMMUNITY): Payer: Self-pay

## 2020-09-03 ENCOUNTER — Other Ambulatory Visit: Payer: Self-pay

## 2020-09-03 DIAGNOSIS — J45909 Unspecified asthma, uncomplicated: Secondary | ICD-10-CM | POA: Insufficient documentation

## 2020-09-03 DIAGNOSIS — M62830 Muscle spasm of back: Secondary | ICD-10-CM | POA: Insufficient documentation

## 2020-09-03 DIAGNOSIS — Z7951 Long term (current) use of inhaled steroids: Secondary | ICD-10-CM | POA: Insufficient documentation

## 2020-09-03 DIAGNOSIS — M549 Dorsalgia, unspecified: Secondary | ICD-10-CM | POA: Diagnosis present

## 2020-09-03 DIAGNOSIS — Z87891 Personal history of nicotine dependence: Secondary | ICD-10-CM | POA: Diagnosis not present

## 2020-09-03 LAB — CBC WITH DIFFERENTIAL/PLATELET
Abs Immature Granulocytes: 0.01 10*3/uL (ref 0.00–0.07)
Basophils Absolute: 0 10*3/uL (ref 0.0–0.1)
Basophils Relative: 1 %
Eosinophils Absolute: 0.2 10*3/uL (ref 0.0–0.5)
Eosinophils Relative: 4 %
HCT: 43.6 % (ref 36.0–46.0)
Hemoglobin: 14 g/dL (ref 12.0–15.0)
Immature Granulocytes: 0 %
Lymphocytes Relative: 19 %
Lymphs Abs: 0.9 10*3/uL (ref 0.7–4.0)
MCH: 29.3 pg (ref 26.0–34.0)
MCHC: 32.1 g/dL (ref 30.0–36.0)
MCV: 91.2 fL (ref 80.0–100.0)
Monocytes Absolute: 0.5 10*3/uL (ref 0.1–1.0)
Monocytes Relative: 10 %
Neutro Abs: 3.2 10*3/uL (ref 1.7–7.7)
Neutrophils Relative %: 66 %
Platelets: 276 10*3/uL (ref 150–400)
RBC: 4.78 MIL/uL (ref 3.87–5.11)
RDW: 13 % (ref 11.5–15.5)
WBC: 4.9 10*3/uL (ref 4.0–10.5)
nRBC: 0 % (ref 0.0–0.2)

## 2020-09-03 LAB — COMPREHENSIVE METABOLIC PANEL
ALT: 22 U/L (ref 0–44)
AST: 22 U/L (ref 15–41)
Albumin: 3.9 g/dL (ref 3.5–5.0)
Alkaline Phosphatase: 56 U/L (ref 38–126)
Anion gap: 7 (ref 5–15)
BUN: 10 mg/dL (ref 6–20)
CO2: 27 mmol/L (ref 22–32)
Calcium: 8.9 mg/dL (ref 8.9–10.3)
Chloride: 103 mmol/L (ref 98–111)
Creatinine, Ser: 0.67 mg/dL (ref 0.44–1.00)
GFR, Estimated: >60 mL/min (ref >60)
Glucose, Bld: 107 mg/dL — ABNORMAL HIGH (ref 70–99)
Potassium: 3.6 mmol/L (ref 3.5–5.1)
Sodium: 137 mmol/L (ref 135–145)
Total Bilirubin: 0.6 mg/dL (ref 0.3–1.2)
Total Protein: 7.1 g/dL (ref 6.5–8.1)

## 2020-09-03 LAB — LIPASE, BLOOD: Lipase: 31 U/L (ref 11–51)

## 2020-09-03 MED ORDER — METHOCARBAMOL 500 MG PO TABS: 500.0000 mg | ORAL_TABLET | Freq: Four times a day (QID) | ORAL | 0 refills | Status: DC | PRN

## 2020-09-03 NOTE — ED Notes (Signed)
Patient verbalizes understanding of discharge instructions. Opportunity for questioning and answers were provided. Pt discharged from ED. 

## 2020-09-03 NOTE — Discharge Instructions (Signed)
It was wonderful to see you today.  You can take Tylenol up to 4000 mg daily, 1000 mg at 1 time.  You can additionally alternate this with Advil or Aleve.  You can take naproxen 500 mg twice daily or the ibuprofen 800 mg 2-3 times daily for relief.  I have sent in a muscle relaxer, this can make you drowsy, please only use this when you were going to stay at home.  You can continue to ice 20 minutes at a time several times a day for relief.  If this is getting worse and you have any extremity weakness, numbness, chest pain, or shortness of breath please return to the ED.

## 2020-09-03 NOTE — ED Notes (Signed)
Dr. Higinio Plan aware of HTN; Pt directed to follow up with PCP

## 2020-09-03 NOTE — ED Provider Notes (Signed)
Emergency Medicine Provider Triage Evaluation Note  Elizabeth Pratt , a 60 y.o. female  was evaluated in triage.  Pt complains of upper back pain beginning 5/28, burning, nonradiating. Accompanied by nausea and epigastric soreness.  Notes she was bitten by a black widow spider earlier in the day on 5/28.  Review of Systems  Positive: Right upper back pain, epigastric discomfort Negative: Vomiting, fever, weakness, other abdominal discomfort  Physical Exam  BP (!) 161/116 (BP Location: Right Arm)   Pulse 65   Temp 98.2 F (36.8 C) (Oral)   Resp 15   SpO2 98%  Gen:   Awake, no distress   Resp:  Normal effort  MSK:   Moves extremities without difficulty  Other:  Tenderness to the right upper back over the scapula; no abdominal tenderness  Medical Decision Making  Medically screening exam initiated at 9:51 AM.  Appropriate orders placed.  Elizabeth Pratt was informed that the remainder of the evaluation will be completed by another provider, this initial triage assessment does not replace that evaluation, and the importance of remaining in the ED until their evaluation is complete.     Lorayne Bender, PA-C 09/03/20 [id number]    Isla Pence, MD 09/03/20 [phone]

## 2020-09-03 NOTE — ED Provider Notes (Signed)
[facility] EMERGENCY DEPARTMENT Provider Note   CSN: [id number] Arrival date & time: 09/03/20  4270     History Chief Complaint  Patient presents with  . Back Pain    Elizabeth Pratt is a 60 y.o. female, with a history of anxiety and asthma, presenting for evaluation of right upper back pain.  Reports this initially started on 5/28 evening, right upper back "over the wing" sharp pain and burning that is constant in nature.  No preceding injury/trauma or increased exercise regimen.  States that she was bit by a black widow spider (saw black spider with red) on the palmar surface of her left hand earlier that day (a few hours before). States she had some redness of the bite area, but no other muscle pains or swelling.  She put ice on it and the redness resolved in the next few days.  Denies any chest pain, palpitations, shortness of breath, rash, leg swelling.  No associated leg/extremity weakness, numbness, saddle anesthesia, or bowel/bladder incontinence/retention. Some nausea (states she will often experience this with pain), no concurrent abdominal pain, fever, vomiting, or change in BM. She is having difficulty sleeping due to the discomfort.  She has been taking Aleve and using ice/heat with some relief. Eating and drinking as normal.   No formal history of hypertension that she is aware of, states she was last her PCP 1 month ago and her systolic blood pressure was in the 140s at that time.  Does not take any medication.     Past Medical History:  Diagnosis Date  . Asthma dx age 52  . Diverticulitis   . Diverticulosis   . Migraines    poss atypical ? MRI  . Obesity (BMI 30-39.9) 03/26/2014  . Pneumonia   . Vertigo     Patient Active Problem List   Diagnosis Date Noted  . GAD (generalized anxiety disorder) 02/25/2018  . Attention deficit hyperactivity disorder, combined type, moderate 02/25/2018  . Acute sinusitis 09/27/2015  . Eustachian tube dysfunction  05/14/2015  . Fatty liver 03/26/2014  . Renal stone 03/26/2014  . Acute bronchitis 03/26/2014  . Diverticulitis 03/25/2014  . Sigmoid diverticulitis 03/25/2014  . Asthma, chronic 03/25/2014  . Acute upper respiratory infections of unspecified site 03/12/2013  . Family history of thyroid disease 07/24/2012  . Hair thinning 07/24/2012  . Acute infective gastroenteritis 02/25/2011  . BENIGN PAROXYSMAL POSITIONAL VERTIGO 04/10/2009  . Seasonal and perennial allergic rhinitis 04/13/2008  . TOBACCO USE in remission 02/20/2008  . COUGH 02/20/2008  . Asthma with bronchitis 01/16/2008    Past Surgical History:  Procedure Laterality Date  . dental extractions    . dental extractions       OB History   No obstetric history on file.     Family History  Problem Relation Age of Onset  . Asthma Mother   . Breast cancer Mother   . Asthma Sister        smoker  . Asthma Sister        smoker  . Breast cancer Sister   . Ovarian cancer Sister   . ADD / ADHD Son     Social History   Tobacco Use  . Smoking status: Former Smoker    Packs/day: 0.10    Types: Cigarettes    Quit date: 03/30/2014    Years since quitting: 6.4  . Smokeless tobacco: Never Used  . Tobacco comment: has quit "lots of times"  Vaping Use  . Vaping Use:  Never used  Substance Use Topics  . Alcohol use: Yes  . Drug use: No    Home Medications Prior to Admission medications   Medication Sig Start Date End Date Taking? Authorizing Provider  methocarbamol (ROBAXIN) 500 MG tablet Take 1 tablet (500 mg total) by mouth every 6 (six) hours as needed for muscle spasms. 09/03/20  Yes Mikhai Bienvenue N, DO  albuterol (PROVENTIL) (2.5 MG/3ML) 0.083% nebulizer solution Take 3 mLs (2.5 mg total) by nebulization every 6 (six) hours as needed for wheezing or shortness of breath. 07/05/18   Baird Lyons D, MD  albuterol (VENTOLIN HFA) 108 (90 Base) MCG/ACT inhaler TAKE 2 PUFFS BY MOUTH EVERY 6 HOURS AS NEEDED FOR WHEEZE OR  SHORTNESS OF BREATH 07/29/20   Young, Clinton D, MD  BREO ELLIPTA 100-25 MCG/INH AEPB INHALE 1 PUFF BY MOUTH THEN RINSE MOUTH, ONCE DAILY 04/02/20   Baird Lyons D, MD  cetirizine (ZYRTEC) 10 MG tablet Take 10 mg by mouth daily.    [provider]  COVID-19 mRNA vaccine, Pfizer, 30 MCG/0.3ML injection USE AS DIRECTED 03/06/20 03/06/21  Carlyle Basques, MD  fluticasone [facility]) 50 MCG/ACT nasal spray 1-2 puffs each nostril once daily 07/19/19   Baird Lyons D, MD  triamcinolone cream (KENALOG) 0.1 % Apply 1 application topically 2 (two) times daily. 11/25/16   Panosh, Standley Brooking, MD    Allergies    Demerol [meperidine], Dilaudid [hydromorphone hcl], Meloxicam, Penicillins, Sulfonamide derivatives, and Meperidine hcl  Review of Systems   Review of Systems  Constitutional: Negative for chills, fatigue and fever.  Respiratory: Negative for shortness of breath and wheezing.   Cardiovascular: Negative for chest pain.  Gastrointestinal: Positive for nausea. Negative for diarrhea and vomiting.  Genitourinary: Negative for dysuria.  Musculoskeletal: Positive for back pain. Negative for gait problem, joint swelling, neck pain and neck stiffness.  Skin: Negative for rash and wound.  Neurological: Negative for dizziness, weakness, light-headedness, numbness and headaches.    Physical Exam Updated Vital Signs BP (!) 196/96   Pulse 61   Temp 98.2 F (36.8 C) (Oral)   Resp 19   SpO2 98%   Physical Exam Constitutional:      General: She is not in acute distress.    Appearance: Normal appearance. She is not ill-appearing.  HENT:     Head: Normocephalic and atraumatic.     Mouth/Throat:     Mouth: Mucous membranes are moist.  Cardiovascular:     Rate and Rhythm: Normal rate and regular rhythm.     Pulses: Normal pulses.  Pulmonary:     Effort: Pulmonary effort is normal.     Breath sounds: Normal breath sounds.  Abdominal:     Palpations: Abdomen is soft.     Tenderness: There is  no right CVA tenderness or left CVA tenderness.  Musculoskeletal:     Cervical back: Neck supple.     Comments: No overt deformity, swelling, ecchymoses, rash, or erythema over upper back. Tenderness to palpation of right sided paraspinal/scapular musculature at a level approximate T5-T7.  No tenderness of thoracic/cervical spinous processes. Full thoracic ROM without pain. 5/5 upper and lower extremity strength bilaterally. Sensation to light touch intact throughout bilateral upper/lower extremity.  Skin:    General: Skin is warm and dry.     Comments: Small red macule present at location of bite on palmar surface of left hand in between third/fourth digits without any surrounding erythema, swelling, or warmth to touch.  Neurological:     General:  No focal deficit present.     Mental Status: She is alert and oriented to person, place, and time.  Psychiatric:        Mood and Affect: Mood normal.        Behavior: Behavior normal.     ED Results / Procedures / Treatments   Labs (all labs ordered are listed, but only abnormal results are displayed) Labs Reviewed  COMPREHENSIVE METABOLIC PANEL - Abnormal; Notable for the following components:      Result Value   Glucose, Bld 107 (*)    All other components within normal limits  LIPASE, BLOOD  CBC WITH DIFFERENTIAL/PLATELET    EKG None  Radiology DG Chest 2 View  Result Date: 09/03/2020 CLINICAL DATA:  Back pain. EXAM: CHEST - 2 VIEW COMPARISON:  07/19/2019. FINDINGS: Mediastinum and hilar structures normal. Cardiomegaly. No pulmonary venous congestion. No focal infiltrate. No pleural effusion or pneumothorax. Degenerative change thoracic spine. IMPRESSION: 1.  Cardiomegaly.  No pulmonary venous congestion. 2.  No acute infiltrate. Electronically Signed   By: Marcello Moores  Register   On: 09/03/2020 10:33    Procedures Procedures   Medications Ordered in ED Medications - No data to display  ED Course  I have reviewed the triage  vital signs and the nursing notes.  Pertinent labs & imaging results that were available during my care of the patient were reviewed by me and considered in my medical decision making (see chart for details).    MDM Rules/Calculators/A&P                          60 year old female, with a history of asthma and anxiety, presenting for evaluation of sharp and burning right upper thoracic back pain for the past few days.  She is hypertensive on arrival, but otherwise hemodynamically stable and in no acute distress.  Pain is easily reducible with palpation of her right upper thoracic/scapular muscular region without any overlying swelling, ecchymoses, erythema, or extremity weakness/numbness.  No concurrent red flags.  No preceding injury or trauma, low suspicion for fracture.  CBC, CMP, and lipase unremarkable.  CXR clear without PNA as to cause referred pain, mild degenerative changes seen in thoracic spine without acute abnormality.  While she is hypertensive here in the ED (asymptomatic), attribute this to anxiety, pain, and likely uncontrolled hypertension given elevated pressures in previous office visits as well.  Doubt dissection without any additional concerning s/sx and reproducible.   Suspect muscle spasm, may be aggravated arthritis or radicular given burning sensation.  Could also consider related to recent spider bite, however would have expected spasms on the affected side.  Rx'd Robaxin to use nightly.  Continue use of Tylenol/Aleve, ice/heat.  Encourage follow-up with PCP in the next week, ED precautions discussed.   Final Clinical Impression(s) / ED Diagnoses Final diagnoses:  Muscle spasm of back    Rx / DC Orders ED Discharge Orders         Ordered    methocarbamol (ROBAXIN) 500 MG tablet  Every 6 hours PRN        09/03/20 Mansura, Willacy, DO 09/03/20 1513    Elnora Morrison, MD 09/03/20 [phone]

## 2020-09-03 NOTE — ED Triage Notes (Signed)
Pt reports Right mid/upper back burning pain starting Saturday after working in the yard. Pt reports she was only spraying her yard, lifted one plant. Pt reports she got bit on her Left hand by a black widow spider. Pt c/o Left hand pain x24 hours. Also c/o nausea

## 2020-11-12 NOTE — Assessment & Plan Note (Signed)
Mild persistent uncomplicated Plan- discussed comparable bronchodilator so she can price compare and let us know which to fill.

## 2020-11-12 NOTE — Assessment & Plan Note (Signed)
For now she can manage Spring symptoms with flonase, antihistamines as needed

## 2020-12-23 ENCOUNTER — Telehealth: Payer: Self-pay | Admitting: Internal Medicine

## 2020-12-23 MED ORDER — MOLNUPIRAVIR EUA 200MG CAPSULE: 4.0000 | ORAL_CAPSULE | Freq: Two times a day (BID) | ORAL | 0 refills | Status: AC

## 2020-12-23 NOTE — Telephone Encounter (Signed)
Spoke with the pt and notified of response per Dr Annamaria Boots  She verbalized understanding  Rx sent to her preferred pharm

## 2020-12-23 NOTE — Telephone Encounter (Signed)
Spoke with the pt  She says started having fatigue and cold symptoms 12/19/20 She took 2 at home covid tests on 12/21/20 and both of them were positive  She says congestion started in her sinuses and now seems more in her chest  She is coughing up some light yellow sputum  She has had bad sore throat, today this seems a little better  No fever or aches but feeling tired  She has started back on her Breo  She is using mucinex 1200  She has not used neb yet- advised to try this if she starts wheezing or SOB  She is asking if there is anything else she should be doing  Note that she had 3 covid vax so far Please advise thanks!  Allergies  Allergen Reactions   Demerol [Meperidine] Nausea And Vomiting   Dilaudid [Hydromorphone Hcl] Nausea And Vomiting   Meloxicam Other (See Comments)    Dizziness   Penicillins Other (See Comments)    Allergy "runs in the family"   Sulfonamide Derivatives Nausea And Vomiting   Meperidine Hcl Other (See Comments)    Abdominal Pain

## 2020-12-23 NOTE — Telephone Encounter (Signed)
Recommend molnupiravir antiviral med 800 mg twice daily x 5 days

## 2020-12-25 ENCOUNTER — Telehealth: Payer: Self-pay | Admitting: Internal Medicine

## 2020-12-25 NOTE — Telephone Encounter (Signed)
I called and spoke with the pt  She states feeling well  She bought a pulse ox and sats have been around 94% ra  I advised her that this is normal  She asked if should be okay to visit her sister early oct  Her covid symptoms started 9/17 so I advised this is okay  Nothing further needed

## 2021-04-04 ENCOUNTER — Emergency Department (HOSPITAL_COMMUNITY)
Admission: EM | Admit: 2021-04-04 | Discharge: 2021-04-05 | Disposition: A | Discharge status: Home / Self Care | Payer: 59 | Attending: Emergency Medicine | Admitting: Emergency Medicine

## 2021-04-04 ENCOUNTER — Other Ambulatory Visit: Payer: Self-pay

## 2021-04-04 ENCOUNTER — Emergency Department (HOSPITAL_COMMUNITY): Payer: 59

## 2021-04-04 ENCOUNTER — Encounter (HOSPITAL_COMMUNITY): Payer: Self-pay

## 2021-04-04 DIAGNOSIS — J45909 Unspecified asthma, uncomplicated: Secondary | ICD-10-CM | POA: Insufficient documentation

## 2021-04-04 DIAGNOSIS — Z87891 Personal history of nicotine dependence: Secondary | ICD-10-CM | POA: Insufficient documentation

## 2021-04-04 DIAGNOSIS — R1031 Right lower quadrant pain: Secondary | ICD-10-CM | POA: Diagnosis present

## 2021-04-04 DIAGNOSIS — N201 Calculus of ureter: Secondary | ICD-10-CM | POA: Diagnosis not present

## 2021-04-04 DIAGNOSIS — Z7951 Long term (current) use of inhaled steroids: Secondary | ICD-10-CM | POA: Diagnosis not present

## 2021-04-04 DIAGNOSIS — Z20822 Contact with and (suspected) exposure to covid-19: Secondary | ICD-10-CM | POA: Diagnosis not present

## 2021-04-04 DIAGNOSIS — R55 Syncope and collapse: Secondary | ICD-10-CM | POA: Diagnosis not present

## 2021-04-04 DIAGNOSIS — N200 Calculus of kidney: Secondary | ICD-10-CM

## 2021-04-04 LAB — COMPREHENSIVE METABOLIC PANEL
ALT: 25 U/L (ref 0–44)
AST: 27 U/L (ref 15–41)
Albumin: 4.1 g/dL (ref 3.5–5.0)
Alkaline Phosphatase: 64 U/L (ref 38–126)
Anion gap: 8 (ref 5–15)
BUN: 18 mg/dL (ref 8–23)
CO2: 26 mmol/L (ref 22–32)
Calcium: 9 mg/dL (ref 8.9–10.3)
Chloride: 102 mmol/L (ref 98–111)
Creatinine, Ser: 0.93 mg/dL (ref 0.44–1.00)
GFR, Estimated: >60 mL/min (ref >60)
Glucose, Bld: 110 mg/dL — ABNORMAL HIGH (ref 70–99)
Potassium: 3.6 mmol/L (ref 3.5–5.1)
Sodium: 136 mmol/L (ref 135–145)
Total Bilirubin: 0.4 mg/dL (ref 0.3–1.2)
Total Protein: 7.5 g/dL (ref 6.5–8.1)

## 2021-04-04 LAB — CBC
HCT: 44.4 % (ref 36.0–46.0)
Hemoglobin: 14.2 g/dL (ref 12.0–15.0)
MCH: 28.6 pg (ref 26.0–34.0)
MCHC: 32 g/dL (ref 30.0–36.0)
MCV: 89.5 fL (ref 80.0–100.0)
Platelets: 289 10*3/uL (ref 150–400)
RBC: 4.96 MIL/uL (ref 3.87–5.11)
RDW: 13 % (ref 11.5–15.5)
WBC: 9 10*3/uL (ref 4.0–10.5)
nRBC: 0 % (ref 0.0–0.2)

## 2021-04-04 LAB — LIPASE, BLOOD: Lipase: 33 U/L (ref 11–51)

## 2021-04-04 LAB — URINALYSIS, ROUTINE W REFLEX MICROSCOPIC
Bacteria, UA: NONE SEEN
Bilirubin Urine: NEGATIVE
Glucose, UA: NEGATIVE mg/dL
Ketones, ur: 20 mg/dL — AB
Nitrite: NEGATIVE
Protein, ur: NEGATIVE mg/dL
RBC / HPF: >50 RBC/hpf — ABNORMAL HIGH (ref 0–5)
Specific Gravity, Urine: 1.02 (ref 1.005–1.030)
WBC, UA: >50 WBC/hpf — ABNORMAL HIGH (ref 0–5)
pH: 7 (ref 5.0–8.0)

## 2021-04-04 LAB — RESP PANEL BY RT-PCR (FLU A&B, COVID) ARPGX2
Influenza A by PCR: NEGATIVE
Influenza B by PCR: NEGATIVE
SARS Coronavirus 2 by RT PCR: NEGATIVE

## 2021-04-04 MED ORDER — SODIUM CHLORIDE 0.9 % IV BOLUS
1000.0000 mL | Freq: Once | INTRAVENOUS | Status: AC
Start: 2021-04-04 — End: 2021-04-04
  Administered 2021-04-04: 20:00:00 1000 mL via INTRAVENOUS

## 2021-04-04 MED ORDER — ONDANSETRON HCL 4 MG/2ML IJ SOLN
4.0000 mg | Freq: Once | INTRAMUSCULAR | Status: AC
  Administered 2021-04-04: 20:00:00 4 mg via INTRAVENOUS
  Filled 2021-04-04: qty 2

## 2021-04-04 MED ORDER — KETOROLAC TROMETHAMINE 30 MG/ML IJ SOLN
30.0000 mg | Freq: Once | INTRAMUSCULAR | Status: AC
  Administered 2021-04-04: 20:00:00 30 mg via INTRAVENOUS
  Filled 2021-04-04: qty 1

## 2021-04-04 MED ORDER — MORPHINE SULFATE (PF) 4 MG/ML IV SOLN
4.0000 mg | Freq: Once | INTRAVENOUS | Status: AC
  Administered 2021-04-04: 21:00:00 4 mg via INTRAVENOUS
  Filled 2021-04-04: qty 1

## 2021-04-04 NOTE — ED Notes (Signed)
Pt transported to radiology.

## 2021-04-04 NOTE — ED Notes (Signed)
Denies any needs at this time.

## 2021-04-04 NOTE — ED Notes (Signed)
Pt asking about test results advised would speak with provider reference same. Denies any other needs at this time

## 2021-04-04 NOTE — ED Provider Notes (Signed)
[facility] EMERGENCY DEPARTMENT Provider Note   CSN: [id number] Arrival date & time: 04/04/21  0623     History Chief Complaint  Patient presents with   Abdominal Pain    Elizabeth Pratt is a 60 y.o. female.  HPI  60 year old female with past medical history of migraines, kidney stones, diverticulitis presents emergency department for right-sided abdominal pain.  Patient states 2 days ago she had an episode of right lower quadrant abdominal pain, associated with nausea/vomiting.  This resolved.  For about a day she felt like she had a UTI.  Earlier today the pain returned, was more severe, radiating to her right back.  Associated with nausea/vomiting.  Patient was retching so hard that she had what was reported as a syncopal episode in the lobby.  Normal bowel movement this morning.  At this time denies any dysuria, hematuria.  Has never had any surgeries in her abdomen before.  Past Medical History:  Diagnosis Date   Asthma dx age 53   Diverticulitis    Diverticulosis    Migraines    poss atypical ? MRI   Obesity (BMI 30-39.9) 03/26/2014   Pneumonia    Vertigo     Patient Active Problem List   Diagnosis Date Noted   GAD (generalized anxiety disorder) 02/25/2018   Attention deficit hyperactivity disorder, combined type, moderate 02/25/2018   Acute sinusitis 09/27/2015   Eustachian tube dysfunction 05/14/2015   Fatty liver 03/26/2014   Renal stone 03/26/2014   Acute bronchitis 03/26/2014   Diverticulitis 03/25/2014   Sigmoid diverticulitis 03/25/2014   Asthma, chronic 03/25/2014   Acute upper respiratory infections of unspecified site 03/12/2013   Family history of thyroid disease 07/24/2012   Hair thinning 07/24/2012   Acute infective gastroenteritis 02/25/2011   BENIGN PAROXYSMAL POSITIONAL VERTIGO 04/10/2009   Seasonal and perennial allergic rhinitis 04/13/2008   TOBACCO USE in remission 02/20/2008   COUGH 02/20/2008   Asthma with bronchitis  01/16/2008    Past Surgical History:  Procedure Laterality Date   dental extractions     dental extractions       OB History   No obstetric history on file.     Family History  Problem Relation Age of Onset   Asthma Mother    Breast cancer Mother    Asthma Sister        smoker   Asthma Sister        smoker   Breast cancer Sister    Ovarian cancer Sister    ADD / ADHD Son     Social History   Tobacco Use   Smoking status: Former    Packs/day: 0.10    Types: Cigarettes    Quit date: 03/30/2014    Years since quitting: 7.0   Smokeless tobacco: Never   Tobacco comments:    has quit "lots of times"  Vaping Use   Vaping Use: Never used  Substance Use Topics   Alcohol use: Yes   Drug use: No    Home Medications Prior to Admission medications   Medication Sig Start Date End Date Taking? Authorizing Provider  albuterol (PROVENTIL) (2.5 MG/3ML) 0.083% nebulizer solution Take 3 mLs (2.5 mg total) by nebulization every 6 (six) hours as needed for wheezing or shortness of breath. 07/05/18  Yes Young, Tarri Fuller D, MD  albuterol (VENTOLIN HFA) 108 (90 Base) MCG/ACT inhaler TAKE 2 PUFFS BY MOUTH EVERY 6 HOURS AS NEEDED FOR WHEEZE OR SHORTNESS OF BREATH Patient taking differently: 2 puffs every  6 (six) hours as needed for shortness of breath. 07/29/20  Yes Young, Clinton D, MD  BREO ELLIPTA 100-25 MCG/INH AEPB INHALE 1 PUFF BY MOUTH THEN RINSE MOUTH, ONCE DAILY Patient taking differently: 1 puff daily. 04/02/20  Yes Young, Tarri Fuller D, MD  cetirizine (ZYRTEC) 10 MG tablet Take 10 mg by mouth daily.   Yes [provider]  Cyanocobalamin (VITAMIN B-12 PO) Take 1 tablet by mouth daily.   Yes [provider]  VITAMIN D PO Take 1 tablet by mouth daily.   Yes [provider]  VITAMIN E PO Take 1 tablet by mouth daily.   Yes [provider]  fluticasone (FLONASE) 50 MCG/ACT nasal spray 1-2 puffs each nostril once daily Patient not taking: Reported on  04/04/2021 07/19/19   Deneise Lever, MD  methocarbamol (ROBAXIN) 500 MG tablet Take 1 tablet (500 mg total) by mouth every 6 (six) hours as needed for muscle spasms. Patient not taking: Reported on 04/04/2021 09/03/20   Patriciaann Clan, DO  triamcinolone cream (KENALOG) 0.1 % Apply 1 application topically 2 (two) times daily. Patient not taking: Reported on 04/04/2021 11/25/16   Burnis Medin, MD    Allergies    Demerol [meperidine], Dilaudid [hydromorphone hcl], Meloxicam, Penicillins, Sulfonamide derivatives, and Meperidine hcl  Review of Systems   Review of Systems  Constitutional:  Positive for appetite change and chills. Negative for fever.  HENT:  Negative for congestion.   Eyes:  Negative for visual disturbance.  Respiratory:  Negative for shortness of breath.   Cardiovascular:  Negative for chest pain.  Gastrointestinal:  Positive for abdominal pain, nausea and vomiting. Negative for diarrhea.  Genitourinary:  Positive for flank pain and frequency. Negative for dysuria, pelvic pain, vaginal bleeding and vaginal discharge.  Musculoskeletal:  Positive for back pain.  Skin:  Negative for rash.  Neurological:  Negative for headaches.   Physical Exam Updated Vital Signs BP (!) 152/125    Pulse 64    Temp 97.6 F (36.4 C) (Oral)    Resp 17    Ht 5\' 8"  (1.727 m)    Wt 90.7 kg    SpO2 99%    BMI 30.41 kg/m   Physical Exam Vitals and nursing note reviewed.  Constitutional:      Appearance: Normal appearance.  HENT:     Head: Normocephalic.     Mouth/Throat:     Mouth: Mucous membranes are moist.  Cardiovascular:     Rate and Rhythm: Normal rate.  Pulmonary:     Effort: Pulmonary effort is normal. No respiratory distress.  Abdominal:     General: Bowel sounds are normal. There is no distension.     Palpations: Abdomen is soft.     Tenderness: There is abdominal tenderness in the right lower quadrant and periumbilical area. There is right CVA tenderness.     Hernia: No  hernia is present.  Skin:    General: Skin is warm.  Neurological:     Mental Status: She is alert and oriented to person, place, and time. Mental status is at baseline.  Psychiatric:        Mood and Affect: Mood normal.    ED Results / Procedures / Treatments   Labs (all labs ordered are listed, but only abnormal results are displayed) Labs Reviewed  COMPREHENSIVE METABOLIC PANEL - Abnormal; Notable for the following components:      Result Value   Glucose, Bld 110 (*)    All other components within  normal limits  LIPASE, BLOOD  CBC  URINALYSIS, ROUTINE W REFLEX MICROSCOPIC    EKG EKG Interpretation  Date/Time:  Friday April 04 2021 19:29:25 EST Ventricular Rate:  58 PR Interval:  164 QRS Duration: 116 QT Interval:  466 QTC Calculation: 458 R Axis:   -24 Text Interpretation: Sinus rhythm Incomplete right bundle branch block Similar to previous Confirmed by Lavenia Atlas [phone]) on 04/04/2021 7:45:06 PM  Radiology No results found.  Procedures Procedures   Medications Ordered in ED Medications  morphine 4 MG/ML injection 4 mg (has no administration in time range)  ondansetron (ZOFRAN) injection 4 mg (4 mg Intravenous Given 04/04/21 1949)  sodium chloride 0.9 % bolus 1,000 mL (1,000 mLs Intravenous New Bag/Given 04/04/21 1948)  ketorolac (TORADOL) 30 MG/ML injection 30 mg (30 mg Intravenous Given 04/04/21 1949)    ED Course  I have reviewed the triage vital signs and the nursing notes.  Pertinent labs & imaging results that were available during my care of the patient were reviewed by me and considered in my medical decision making (see chart for details).    MDM Rules/Calculators/A&P                          60 year old female past medical history kidney stones presents emergency department right lower quadrant abdominal pain that radiates around to her right flank.  Initially had urinary symptoms.  Vitals are stable on arrival, abdomen is tender but  nonacute.  Work-up thus far is significant for hematuria, no signs of UTI.  CT of the abdomen pelvis shows a 3 mm right UVJ stone with some mild hydronephrosis and stranding.  Kidney function is normal.  Plan for outpatient symptomatic medication for passing and urology follow-up.  Strict return to ED precautions discussed.     Final Clinical Impression(s) / ED Diagnoses Final diagnoses:  None    Rx / DC Orders ED Discharge Orders     None        Lorelle Gibbs, DO 04/05/21 0020

## 2021-04-04 NOTE — ED Triage Notes (Signed)
Abd RLQ pain that radiates to the back started approximately an hr ago. Same symptom on Wed but not this bad. Per staff pt had a syncopal episode in lobby. Vomiting on Wed and today normal BM.

## 2021-04-05 MED ORDER — HYDROCODONE-ACETAMINOPHEN 7.5-325 MG PO TABS: 1.0000 | ORAL_TABLET | Freq: Four times a day (QID) | ORAL | 0 refills | Status: DC | PRN

## 2021-04-05 MED ORDER — TAMSULOSIN HCL 0.4 MG PO CAPS: 0.4000 mg | ORAL_CAPSULE | Freq: Every day | ORAL | 0 refills | Status: DC

## 2021-04-05 MED ORDER — HYDROCODONE-ACETAMINOPHEN 5-325 MG PO TABS
1.5000 | ORAL_TABLET | Freq: Four times a day (QID) | ORAL | Status: DC | PRN
  Administered 2021-04-05: 1.5 via ORAL
  Filled 2021-04-05: qty 2

## 2021-04-05 MED ORDER — ONDANSETRON 4 MG PO TBDP
4.0000 mg | ORAL_TABLET | Freq: Once | ORAL | Status: AC
  Administered 2021-04-05: 4 mg via ORAL
  Filled 2021-04-05: qty 1

## 2021-04-05 MED ORDER — ONDANSETRON HCL 4 MG PO TABS: 4.0000 mg | ORAL_TABLET | Freq: Three times a day (TID) | ORAL | 0 refills | Status: DC | PRN

## 2021-04-05 NOTE — ED Notes (Signed)
Pt provided strainer at this time

## 2021-04-05 NOTE — Discharge Instructions (Addendum)
You have been seen and discharged from the emergency department.  Your CAT scan shows a 3 mm stone just above the bladder on the right.  Stay well-hydrated.  You have been prescribed Flomax, this does flag is an interaction since you have had nausea from sulfonamide type medication in the past however this will aid in passing the stone.  You have been sent a prescription for nausea medicine as needed.  Take ibuprofen every 6-8 hours with food and drink. Take stronger pain medicine as needed.  Do not mix this medication with alcohol or other sedating medications. Do not drive or do heavy physical activity until you know how this medication affects you.  It may cause drowsiness.  Establish care with urology for further evaluation.  Follow-up with your primary provider for reevaluation and further care. Take home medications as prescribed. If you have any worsening symptoms, flank pain, high fevers, intractable nausea/vomiting or further concerns for your health please return to an emergency department for further evaluation.

## 2021-04-15 ENCOUNTER — Other Ambulatory Visit: Payer: Self-pay | Admitting: Obstetrics and Gynecology

## 2021-04-15 DIAGNOSIS — Z803 Family history of malignant neoplasm of breast: Secondary | ICD-10-CM

## 2021-04-16 ENCOUNTER — Other Ambulatory Visit: Payer: Self-pay | Admitting: Internal Medicine

## 2021-07-15 ENCOUNTER — Encounter: Payer: Self-pay | Admitting: Gastroenterology

## 2021-07-30 ENCOUNTER — Other Ambulatory Visit: Payer: Self-pay | Admitting: Internal Medicine

## 2021-07-30 ENCOUNTER — Ambulatory Visit: Payer: 59 | Admitting: Internal Medicine

## 2021-07-30 NOTE — Progress Notes (Signed)
? Patient ID: Elizabeth Pratt, female    DOB: [birth year], 60 y.o.   MRN: [id number] ? ?HPI ?Female former smoker followed for asthma, bronchitis, eustachian dysfunction ?PFT 04/30/08- FEV1 2.79/99%, FEV1/FVC 0.75, significant response to bronchodilator, TLC 94%, DLCO 109%. ? ?----------------------------------------------------------------------------- ? ? ?07/31/20- 61 year old female former smoker followed for asthma, bronchitis, eustachian dysfunction, Allergic Rhinitis, complicated by ADHD, Anxiety,  ?-albuterol HFA, Breo 100, neb albuterol,  ?Covid vax-3 Phizer ?Flu vax-  no                           ?Need PFT? ?-----Would like to talk about inhalers to see if there is a generic she can use.  ?Concerned by cost of Breo although she can handle it. Discussed alternatives. ?No acute events. ?CXR 07/19/19-  ?IMPRESSION: ?Mild peribronchial cuffing.  No focal consolidation. ? ?07/31/21- 62 year old female former smoker followed for asthma, bronchitis, eustachian dysfunction, Allergic Rhinitis, complicated by ADHD, Anxiety, Kidney Stone, Covid infection 2022,  ?-albuterol HFA, Breo 100, neb albuterol,  ?Covid vax-3 Phizer ?Flu vax-    no ?-------Follow up. Patient has no complaints. ?Asthma has been controlled.Infrequent need for rescue inhaler ?Had episode unpleasant rhinitis early Spring pollen- ok now. ?CXR 09/03/20- ? IMPRESSION: ?1.  Cardiomegaly.  No pulmonary venous congestion.  ?2.  No acute infiltrate. ?   ? ?ROS-see HPI   + = positive ?Constitutional:   No-   weight loss, night sweats, fevers, chills, fatigue, lassitude. ?HEENT:   No-  headaches, difficulty swallowing, tooth/dental problems, sore throat,  ?     No-  sneezing, itching, ear ache, +  nasal congestion,+ post nasal drip,  ?CV:  No-   chest pain, orthopnea, PND, swelling in lower extremities, anasarca, dizziness, palpitations ?Resp: shortness of breath with  + exertion not at rest.   ?       No-productive cough,  + non-productive cough,  No- coughing up  of blood.   ?           No-   change in color of mucus.  No-wheezing.   ?Skin: + eczema ?GI:  No-   heartburn, indigestion, abdominal pain, nausea, vomiting,  ?GU: , ?MS:  No-   joint pain or swelling.  [name] ?Neuro-     nothing unusual ?Psych:  No- change in mood or affect. No depression or anxiety.  No memory loss. ? ?Objective:  ?OBJ- Physical Exam- unremarkable ?General- Alert, Oriented, Affect-appropriate, Distress- none acute, +overweight ?Skin- rash-none, lesions- none, excoriation- none ?Lymphadenopathy- none ?Head- atraumatic ?           Eyes- Gross vision intact, PERRLA, conjunctivae and secretions clear, + mild horizontal nystagmus ?           Ears- Hearing, canals-normal,  ?           Nose-  no-Septal dev, mucus+, no-polyps, erosion, perforation  ?           Throat- Mallampati II , mucosa clear , drainage- none, tonsils- atrophic ?Neck- flexible , trachea midline, no stridor , thyroid nl, carotid no bruit ?Chest - symmetrical excursion , unlabored ?          Heart/CV- RRR , no murmur , no gallop  , no rub, nl s1 s2 ?                          - JVD- none , edema- none, stasis changes- none, varices- none ?  Lung- clear, cough-none , dullness-none, rub- none ?          Chest wall-  ?Abd- ?Br/ Gen/ Rectal- Not done, not indicated ?Extrem- cyanosis- none, clubbing, none, atrophy- none, strength- nl ?Neuro- grossly intact to observation ? ? ?   ? ? ? ?

## 2021-07-31 ENCOUNTER — Ambulatory Visit (INDEPENDENT_AMBULATORY_CARE_PROVIDER_SITE_OTHER): Payer: Commercial Managed Care - PPO | Admitting: Internal Medicine

## 2021-07-31 ENCOUNTER — Encounter: Payer: Self-pay | Admitting: Internal Medicine

## 2021-07-31 DIAGNOSIS — J302 Other seasonal allergic rhinitis: Secondary | ICD-10-CM

## 2021-07-31 DIAGNOSIS — J3089 Other allergic rhinitis: Secondary | ICD-10-CM

## 2021-07-31 DIAGNOSIS — J45909 Unspecified asthma, uncomplicated: Secondary | ICD-10-CM

## 2021-07-31 MED ORDER — FLUTICASONE FUROATE-VILANTEROL 100-25 MCG/ACT IN AEPB: INHALATION_SPRAY | RESPIRATORY_TRACT | 1 refills | Status: DC

## 2021-07-31 MED ORDER — ALBUTEROL SULFATE HFA 108 (90 BASE) MCG/ACT IN AERS: 2.0000 | INHALATION_SPRAY | Freq: Four times a day (QID) | RESPIRATORY_TRACT | 12 refills | Status: DC | PRN

## 2021-07-31 NOTE — Patient Instructions (Addendum)
Breo and Albuterol inhalers refilled ? ?Please call if we can help ?

## 2021-08-17 NOTE — Assessment & Plan Note (Signed)
Moderate intermittent uncomplicated ?Plan continue Breo ?

## 2021-08-17 NOTE — Assessment & Plan Note (Signed)
Discussed Flonase, antihistamines, saline rinse ?

## 2022-04-11 ENCOUNTER — Other Ambulatory Visit: Payer: Self-pay | Admitting: Internal Medicine

## 2022-04-29 ENCOUNTER — Other Ambulatory Visit: Payer: Self-pay | Admitting: Obstetrics and Gynecology

## 2022-04-29 DIAGNOSIS — Z803 Family history of malignant neoplasm of breast: Secondary | ICD-10-CM

## 2022-09-15 ENCOUNTER — Ambulatory Visit
Admission: RE | Admit: 2022-09-15 | Discharge: 2022-09-15 | Disposition: A | Discharge status: Home / Self Care | Payer: Commercial Managed Care - PPO | Source: Ambulatory Visit | Attending: Obstetrics and Gynecology | Admitting: Obstetrics and Gynecology

## 2022-09-15 DIAGNOSIS — Z803 Family history of malignant neoplasm of breast: Secondary | ICD-10-CM

## 2022-09-15 MED ORDER — GADOPICLENOL 0.5 MMOL/ML IV SOLN
10.0000 mL | Freq: Once | INTRAVENOUS | Status: AC | PRN
  Administered 2022-09-15: 10 mL via INTRAVENOUS

## 2022-09-16 ENCOUNTER — Other Ambulatory Visit: Payer: Self-pay | Admitting: Obstetrics and Gynecology

## 2022-09-16 DIAGNOSIS — R9389 Abnormal findings on diagnostic imaging of other specified body structures: Secondary | ICD-10-CM

## 2022-09-21 ENCOUNTER — Ambulatory Visit
Admission: RE | Admit: 2022-09-21 | Discharge: 2022-09-21 | Disposition: A | Discharge status: Home / Self Care | Payer: Commercial Managed Care - PPO | Source: Ambulatory Visit | Attending: Obstetrics and Gynecology | Admitting: Obstetrics and Gynecology

## 2022-09-21 DIAGNOSIS — R9389 Abnormal findings on diagnostic imaging of other specified body structures: Secondary | ICD-10-CM

## 2022-09-21 MED ORDER — GADOPICLENOL 0.5 MMOL/ML IV SOLN
9.0000 mL | Freq: Once | INTRAVENOUS | Status: AC | PRN
  Administered 2022-09-21: 9 mL via INTRAVENOUS

## 2022-11-18 ENCOUNTER — Other Ambulatory Visit: Payer: Self-pay | Admitting: Internal Medicine

## 2022-11-22 NOTE — Progress Notes (Unsigned)
Patient ID: Elizabeth Pratt, female    DOB: 10/02/1960, 62 y.o.   MRN: [id number]  HPI Female former smoker followed for asthma, bronchitis, eustachian dysfunction PFT 04/30/08- FEV1 2.79/99%, FEV1/FVC 0.75, significant response to bronchodilator, TLC 94%, DLCO 109%.  -----------------------------------------------------------------------------   07/31/21- 62 year old female former smoker followed for asthma, bronchitis, eustachian dysfunction, Allergic Rhinitis, complicated by ADHD, Anxiety, Kidney Stone, Covid infection 2022,  -albuterol HFA, Breo 100, neb albuterol,  Covid vax-3 Phizer Flu vax-    no -------Follow up. Patient has no complaints. Asthma has been controlled.Infrequent need for rescue inhaler Had episode unpleasant rhinitis early Spring pollen- ok now. CXR 09/03/20-  IMPRESSION: 1.  Cardiomegaly.  No pulmonary venous congestion.  2.  No acute infiltrate.  11/24/22- 62 year old female former smoker followed for asthma, bronchitis, eustachian dysfunction, Allergic Rhinitis, complicated by ADHD, Anxiety, Kidney Stone, Covid infection 2022,  -albuterol HFA, Breo 100, neb albuterol,    -----Has been doing pretty well. Had a cold over the weekend, states it went into her sinuses. Stated she stayed in a old house over the weekend. Had run out of Breo. She is resolving recent upper airway congestion ok.  Breast nodule was benign. Strong family hx breast Ca.       Artist- oil paint, still lifes, landscapes Started weight loss med, partly to reduce cancer risk.  ROS-see HPI   + = positive Constitutional:   No-   weight loss, night sweats, fevers, chills, fatigue, lassitude. HEENT:   No-  headaches, difficulty swallowing, tooth/dental problems, sore throat,       No-  sneezing, itching, ear ache, +  nasal congestion,+ post nasal drip,  CV:  No-   chest pain, orthopnea, PND, swelling in lower extremities, anasarca, dizziness, palpitations Resp: shortness of breath with  + exertion  not at rest.          No-productive cough,  + non-productive cough,  No- coughing up of blood.              No-   change in color of mucus.  No-wheezing.   Skin: + eczema GI:  No-   heartburn, indigestion, abdominal pain, nausea, vomiting,  GU: , MS:  No-   joint pain or swelling.  . Neuro-     nothing unusual Psych:  No- change in mood or affect. No depression or anxiety.  No memory loss.  Objective:  OBJ- Physical Exam- unremarkable General- Alert, Oriented, Affect-appropriate, Distress- none acute, +overweight Skin- rash-none, lesions- none, excoriation- none Lymphadenopathy- none Head- atraumatic            Eyes- Gross vision intact, PERRLA, conjunctivae and secretions clear, + mild horizontal nystagmus            Ears- Hearing, canals-normal,             Nose-  no-Septal dev, mucus+, no-polyps, erosion, perforation             Throat- Mallampati II , mucosa clear , drainage- none, tonsils- atrophic Neck- flexible , trachea midline, no stridor , thyroid nl, carotid no bruit Chest - symmetrical excursion , unlabored           Heart/CV- RRR , no murmur , no gallop  , no rub, nl s1 s2                           - JVD- none , edema- none, stasis changes- none, varices- none  Lung- clear, cough-none , dullness-none, rub- none           Chest wall-  Abd- Br/ Gen/ Rectal- Not done, not indicated Extrem- cyanosis- none, clubbing, none, atrophy- none, strength- nl Neuro- grossly intact to observation

## 2022-11-24 ENCOUNTER — Ambulatory Visit (INDEPENDENT_AMBULATORY_CARE_PROVIDER_SITE_OTHER): Payer: Commercial Managed Care - PPO

## 2022-11-24 ENCOUNTER — Ambulatory Visit: Payer: Commercial Managed Care - PPO | Admitting: Internal Medicine

## 2022-11-24 ENCOUNTER — Encounter: Payer: Self-pay | Admitting: Internal Medicine

## 2022-11-24 VITALS — BP 126/82 | HR 67 | Ht 68.0 in | Wt 214.0 lb

## 2022-11-24 DIAGNOSIS — F17200 Nicotine dependence, unspecified, uncomplicated: Secondary | ICD-10-CM | POA: Diagnosis not present

## 2022-11-24 DIAGNOSIS — J4520 Mild intermittent asthma, uncomplicated: Secondary | ICD-10-CM

## 2022-11-24 DIAGNOSIS — J45909 Unspecified asthma, uncomplicated: Secondary | ICD-10-CM | POA: Diagnosis not present

## 2022-11-24 DIAGNOSIS — E669 Obesity, unspecified: Secondary | ICD-10-CM

## 2022-11-24 MED ORDER — FLUTICASONE FUROATE-VILANTEROL 100-25 MCG/ACT IN AEPB: 1.0000 | INHALATION_SPRAY | Freq: Every day | RESPIRATORY_TRACT | 4 refills | Status: DC

## 2022-11-24 NOTE — Assessment & Plan Note (Signed)
Support efforts to regain normal body weight

## 2022-11-24 NOTE — Assessment & Plan Note (Signed)
Encouraged that she has remained in remission

## 2022-11-24 NOTE — Patient Instructions (Signed)
Order- CXR    dx Asthma mild intermittent   Script sent refilling Virgel Bouquet

## 2022-11-24 NOTE — Assessment & Plan Note (Signed)
Moderate intermittent uncomplicated Plan- continue meds, refill Breo

## 2022-12-09 ENCOUNTER — Telehealth: Payer: Self-pay | Admitting: Internal Medicine

## 2022-12-09 DIAGNOSIS — J4520 Mild intermittent asthma, uncomplicated: Secondary | ICD-10-CM

## 2022-12-09 NOTE — Telephone Encounter (Signed)
Patient is calling because she needs a refill of her rescue inhaler albuterol.

## 2022-12-10 MED ORDER — ALBUTEROL SULFATE HFA 108 (90 BASE) MCG/ACT IN AERS: 2.0000 | INHALATION_SPRAY | Freq: Four times a day (QID) | RESPIRATORY_TRACT | 6 refills | Status: DC | PRN

## 2023-02-09 ENCOUNTER — Other Ambulatory Visit: Payer: Self-pay | Admitting: Obstetrics and Gynecology

## 2023-02-09 DIAGNOSIS — Z803 Family history of malignant neoplasm of breast: Secondary | ICD-10-CM

## 2023-04-03 ENCOUNTER — Ambulatory Visit
Admission: RE | Admit: 2023-04-03 | Discharge: 2023-04-03 | Disposition: A | Discharge status: Home / Self Care | Payer: Commercial Managed Care - PPO | Source: Ambulatory Visit | Attending: Obstetrics and Gynecology | Admitting: Obstetrics and Gynecology

## 2023-04-03 DIAGNOSIS — Z803 Family history of malignant neoplasm of breast: Secondary | ICD-10-CM

## 2023-04-03 MED ORDER — GADOPICLENOL 0.5 MMOL/ML IV SOLN
9.0000 mL | Freq: Once | INTRAVENOUS | Status: AC | PRN
  Administered 2023-04-03: 9 mL via INTRAVENOUS

## 2023-06-08 ENCOUNTER — Other Ambulatory Visit: Payer: Self-pay | Admitting: Obstetrics and Gynecology

## 2023-06-08 DIAGNOSIS — N6313 Unspecified lump in the right breast, lower outer quadrant: Secondary | ICD-10-CM

## 2023-06-16 ENCOUNTER — Ambulatory Visit
Admission: RE | Admit: 2023-06-16 | Discharge: 2023-06-16 | Disposition: A | Discharge status: Home / Self Care | Source: Ambulatory Visit | Attending: Obstetrics and Gynecology | Admitting: Obstetrics and Gynecology

## 2023-06-16 DIAGNOSIS — N6313 Unspecified lump in the right breast, lower outer quadrant: Secondary | ICD-10-CM

## 2023-08-25 ENCOUNTER — Encounter: Payer: Self-pay | Admitting: Podiatry

## 2023-08-25 ENCOUNTER — Ambulatory Visit (INDEPENDENT_AMBULATORY_CARE_PROVIDER_SITE_OTHER)

## 2023-08-25 ENCOUNTER — Ambulatory Visit (INDEPENDENT_AMBULATORY_CARE_PROVIDER_SITE_OTHER): Payer: Self-pay | Admitting: Podiatry

## 2023-08-25 VITALS — Ht 68.0 in | Wt 214.0 lb

## 2023-08-25 DIAGNOSIS — G5751 Tarsal tunnel syndrome, right lower limb: Secondary | ICD-10-CM | POA: Diagnosis not present

## 2023-08-25 DIAGNOSIS — M7751 Other enthesopathy of right foot: Secondary | ICD-10-CM | POA: Diagnosis not present

## 2023-08-25 MED ORDER — TRIAMCINOLONE ACETONIDE 10 MG/ML IJ SUSP
10.0000 mg | Freq: Once | INTRAMUSCULAR | Status: AC
  Administered 2023-08-25: 10 mg via INTRA_ARTICULAR

## 2023-08-25 NOTE — Patient Instructions (Signed)
 Tarsal Tunnel Syndrome  Tarsal tunnel syndrome is a condition that happens when a nerve called the tibial nerve is irritated or squeezed (compressed) as it passes through an area on the inside of your ankle (tarsal tunnel). The tarsal tunnel is a narrow passage through the connective tissue and bones in your feet (tarsals). The tibial nerve passes behind the large bony bump in your inner ankle and branches out into your foot and toes. This nerve enables feeling by passing signals to your heel, the bottom of your foot, and some of your toe muscles. Tarsal tunnel syndrome usually causes ankle and foot pain that gets worse with activity. What are the causes? Tarsal tunnel syndrome can be caused by any condition that makes the space in the tarsal tunnel more narrow. Athletes may get tarsal tunnel syndrome from a broken (fractured) ankle or from an ankle sprain that causes scarring or swelling. Other common causes include: Overpronation. This is when your feet roll inward and flatten too much when you stand, walk, or run. Extra pressure on the tarsal tunnel area from tight or stiff shoes or boots. Less room in the tarsal tunnel due to small, fluid-filled sacs (cysts) or growths on the bones near the tunnel. What increases the risk? This condition is more likely to develop in people who: Play sports where they wear high, stiff boots, such as downhill skiing. Play sports with repetitive motion, such as running. Play sports on uneven surfaces that can lead to a sprained ankle, such as soccer or football. Have had an inner ankle injury. Have flat feet. Have other conditions, such as diabetes, hypothyroidism, or rheumatoid arthritis. What are the signs or symptoms? Symptoms of this condition include: Burning pain behind the ankle, in the heel, or in the foot. This pain gets worse if you are standing, walking, or running. Numbness or a prickling and tingling feeling in your heel, foot, or toes. Swelling in  your ankle or heel. At first, your symptoms may get worse with activity and be better with rest. Over time, your symptoms may become constant or come on sooner with less activity. How is this diagnosed? This condition is diagnosed based on: Your symptoms and your medical history. A physical exam. Your health care provider may tap on the area below your ankle to check for tingling in your foot or toes. You may also have other tests, including: X-rays to check bone structure. An MRI or ultrasound to examine the nerve and tendon area to find where your nerve is getting compressed. A study of nerve function (electromyography or EMG). How is this treated? Treatment may include: Wearing a removable splint or boot for ankle support. Using a shoe insert (orthotic) to help support your arch. Taking NSAIDs to reduce pain. Using ice to reduce swelling. Having medicine injected into your ankle joint to reduce pain and swelling. Starting range-of-motion exercises and strengthening exercises. Slowly returning to full activity. The timing will depend on the severity of your condition and your response to treatment. Surgery. Follow these instructions at home: If you have a removable splint or boot: Wear the splint or boot as told by your provider. Remove it only as told by your provider. Check the skin around the splint or boot every day. Tell your provider about any concerns. Loosen the splint or boot if your toes tingle, become numb, or turn cold and blue. Keep the splint or boot clean. If your splint or boot is not waterproof: Do not let it get wet.  Cover it with a watertight covering when you take a bath or shower. Ask your provider when it is safe to drive if your splint or boot is on a foot that you use for driving. Managing pain, stiffness, and swelling  If told, put ice on the injured area. If you have a removable splint or boot, remove it as told by your provider. Put ice in a plastic  bag. Place a towel between your skin and the bag. Leave the ice on for 20 minutes, 2-3 times a day. If your skin turns bright red, remove the ice right away to prevent skin damage. The risk of damage is higher if you cannot feel pain, heat, or cold. Move your toes often to reduce stiffness and swelling. Raise (elevate) your foot above the level of your heart while you are sitting or lying down. Activity Do exercises as told by your provider. Return to your normal activities as told by your provider. Ask your provider what activities are safe for you. General instructions Take over-the-counter and prescription medicines only as told by your provider. Do not use any products that contain nicotine or tobacco. These products include cigarettes, chewing tobacco, and vaping devices, such as e-cigarettes. If you need help quitting, ask your provider. Keep all follow-up visits. Your provider will monitor your healing. How is this prevented? Give your body time to rest between activities. Make sure to wear supportive and comfortable shoes during athletic activity. Do not overtighten ski boots or the laces on high-top shoes. Be safe and responsible while being active to avoid falls. Contact a health care provider if: Your ankle pain is not getting better. You are unable to support body weight on your foot without pain. This information is not intended to replace advice given to you by your health care provider. Make sure you discuss any questions you have with your health care provider. Document Revised: 12/23/2021 Document Reviewed: 12/23/2021 Elsevier Patient Education  2024 [employer].

## 2023-08-26 NOTE — Progress Notes (Signed)
 Subjective:   Patient ID: Elizabeth Pratt, female   DOB: 63 y.o.   MRN: [id number]   HPI Patient states she has had a lot of of tingling and burning in her forefoot right for about 30 years and has had checks for neuroma which at that time were negative and it is now started to affect more in her ankle and she seems to have a lot of shooting and sensitivity of the ankle and that occurs at different times.  States it has really gotten worse over the last 6 months and patient does not remember any injury does not smoke likes to be active   Review of Systems  All other systems reviewed and are negative.       Objective:  Physical Exam Vitals and nursing note reviewed.  Constitutional:      Appearance: She is well-developed.  Pulmonary:     Effort: Pulmonary effort is normal.  Musculoskeletal:        General: Normal range of motion.  Skin:    General: Skin is warm.  Neurological:     Mental Status: She is alert.     Neurovascular status found to be intact muscle strength was found to be adequate range of motion is adequate there is mild discomfort between the interspaces 2 3 and 3 for right with the possibility for small positive Amon Kand sign but difficult to make complete determination with what appears to be a moderate positive Tinel's sign right.  Within the tarsal canal.  Patient is noted to have good digital perfusion well-oriented     Assessment:  Difficult to make complete determination as to condition but this may be tarsal tunnel syndrome versus possible neuroma versus other neuropathy or even inflammatory condition     Plan:  H&P x-rays reviewed discussed and at this point I went ahead and I am going to start with the tarsal canal and I did a careful injection within the tarsal canal 3 mg Dexasone Kenalog  and 10 mg of Marcaine.  I want to see the results of this and that can help may require MRI may require nerve conduction studies in the future  X-rays were negative for  signs of arthritis stress fracture or other bone pathology associated with pain experienced

## 2023-09-08 ENCOUNTER — Encounter: Payer: Self-pay | Admitting: Podiatry

## 2023-09-08 ENCOUNTER — Ambulatory Visit (INDEPENDENT_AMBULATORY_CARE_PROVIDER_SITE_OTHER): Admitting: Podiatry

## 2023-09-08 DIAGNOSIS — G5751 Tarsal tunnel syndrome, right lower limb: Secondary | ICD-10-CM

## 2023-09-08 NOTE — Patient Instructions (Signed)
 While at your last visit, you received a steroid injection in your foot or ankle to help with your pain. Along with having the steroid medication there is some "numbing" medication in the shot that you received. Due to this you may notice some numbness to the area for the next couple of hours.   I would recommend limiting activity for the next few days to help the steroid injection take affect.    The actually benefit from the steroid injection may take up to 2-7 days to see a difference. You may actually experience a small (as in 10%) INCREASE in pain in the first 24 hours---that is common. It would be best if you can ice the area today and take anti-inflammatory medications (such as Ibuprofen, Motrin, or Aleve) if you are able to take these medications. If you were prescribed another medication to help with the pain go ahead and start that medication today    Things to watch out for that you should contact us  or a health care provider urgently would include: 1. Unusual (as in more than 10%) increase in pain 2. New fever > 101.5 3. New swelling or redness of the injected area.  4. Streaking of red lines around the area injected.  If you have any questions or concerns about this, please give our office a call at [phone].

## 2023-09-08 NOTE — Progress Notes (Signed)
 Subjective:   Patient ID: Elizabeth Pratt, female   DOB: 63 y.o.   MRN: [id number]   HPI Patient states that after few hours her foot went numb and that he feels like she had several days of good relief and has had approximate 25% relief in the medial ankle and that the toes are about where they were previous   ROS      Objective:  Physical Exam  Neurovascular status intact still has a relative positive Tinel's sign right with history of shooting type discomfort in the tarsal canal.  There is discomfort mostly in the third interspace right I was not able to elicit Amon Kand sign     Assessment:  Possibility for tarsal tunnel syndrome or possible neuroma or both right foot     Plan:  H&P both reviewed discussed at great length discussed treatment options and at this point I am ordering nerve conduction study to try to understand better the underlying pathology and whether ultimately surgery will be necessary in this case.  Patient agrees and test is ordered at the current time and all issues were discussed with patient concerning what is possible tarsal tunnel neuroma or both or neither

## 2023-09-10 ENCOUNTER — Telehealth: Payer: Self-pay | Admitting: Podiatry

## 2023-09-10 ENCOUNTER — Encounter: Payer: Self-pay | Admitting: Neurology

## 2023-09-10 ENCOUNTER — Other Ambulatory Visit: Payer: Self-pay

## 2023-09-10 DIAGNOSIS — R202 Paresthesia of skin: Secondary | ICD-10-CM

## 2023-09-10 NOTE — Telephone Encounter (Signed)
 Pt calling regarding referral that was supposed to sent for: ordering nerve conduction study.  I do not see a referral sent over to [facility]. Please advise/follow-up. Thank you.

## 2023-10-04 ENCOUNTER — Ambulatory Visit (INDEPENDENT_AMBULATORY_CARE_PROVIDER_SITE_OTHER): Admitting: Neurology

## 2023-10-04 DIAGNOSIS — R202 Paresthesia of skin: Secondary | ICD-10-CM

## 2023-10-04 NOTE — Procedures (Addendum)
 [facility]  [address] [place], [address]  [place], [place] [address] Tel: [phone] Fax: [phone] Test Date:  10/04/2023  Patient: Elizabeth Pratt DOB: Jan 13, 1961 Physician: Venetia Potters, MD  Sex: Female Height: 5' 8 Ref Phys: Pasco Ovens, [place]  ID#: [id number]   Technician:    History: This is a 63 year old female with right foot numbness and tingling.  NCV & EMG Findings: Extensive electrodiagnostic evaluation of the right lower limb with additional nerve conduction studies of the left lower limb shows: Bilateral medial plantar sensory responses are absent. Right sural and superficial peroneal/fibular sensory responses are within normal limits. Right peroneal/fibular (EDB) and tibial (AH) motor responses are within normal limits. Right H reflex latency is within normal limits. There is no evidence of active or chronic motor axon loss changes affecting any of the tested muscles. Motor unit configuration and recruitment pattern is within normal limits.  Impression: This study shows no significant abnormalities. Specifically: No electrodiagnostic evidence of a large fiber sensorimotor neuropathy. No electrodiagnostic evidence of a right lumbosacral (L3-S1) radiculopathy. No definitive electrodiagnostic evidence of a right tibial mononeuropathy at the tarsal tunnel. Medial plantar responses are absent bilaterally, which can be normal for age (commonly absent after the age of 48) or technical in nature. Recommend clinical correlation.    ___________________________ Venetia Potters, MD    Nerve Conduction Studies Motor Nerve Results    Latency Amplitude F-Lat Segment Distance CV Comment  Site (ms) Norm (mV) Norm (ms)  (cm) (m/s) Norm   Right Fibular (EDB) Motor  Ankle 3.5  < 6.0 2.5  > 2.5        Bel fib head 9.8 - 2.3 -  Bel fib head-Ankle 31 49  > 40   Pop fossa 11.7 - 2.2 -  Pop fossa-Bel fib head 8 42 -   Right Tibial (AH) Motor  Ankle 3.8  < 6.0 7.1  > 4.0         Knee 13.0 - 6.7 -  Knee-Ankle 41 45  > 40    Sensory Sites    Neg Peak Lat Amplitude (O-P) Segment Distance Velocity Comment  Site (ms) Norm (V) Norm  (cm) (ms)   Left Medial Plantar (Ortho) Sensory  Great toe-Med mall *NR - *NR - Great toe-Med mall -    Right Medial Plantar (Ortho) Sensory  Great toe-Med mall *NR - *NR - Great toe-Med mall -    Right Superficial Fibular Sensory  14 cm-Ankle 2.7  < 4.6 8  > 3 14 cm-Ankle 14    Right Sural Sensory  Calf-Lat mall 3.8  < 4.6 7  > 3 Calf-Lat mall 14     H-Reflex Results    M-Lat H Lat H Neg Amp H-M Lat  Site (ms) (ms) Norm (mV) (ms)  Right Tibial H-Reflex  Pop fossa 7.4 34.8  < 35.0 0.71 27.4   Electromyography   Side Muscle Ins.Act Fibs Fasc Recrt Amp Dur Poly Activation Comment  Right Tib ant Nml Nml Nml Nml Nml Nml Nml Nml N/A  Right Gastroc MH Nml Nml Nml Nml Nml Nml Nml Nml N/A  Right FDL Nml Nml Nml Nml Nml Nml Nml Nml N/A  Right Rectus fem Nml Nml Nml Nml Nml Nml Nml Nml N/A  Right Biceps fem SH Nml Nml Nml Nml Nml Nml Nml Nml N/A  Right Gluteus med Nml Nml Nml Nml Nml Nml Nml Nml N/A      Waveforms:  Motor  Sensory           H-Reflex

## 2023-10-06 ENCOUNTER — Telehealth: Payer: Self-pay | Admitting: Neurology

## 2023-10-06 ENCOUNTER — Telehealth: Payer: Self-pay

## 2023-10-06 NOTE — Telephone Encounter (Signed)
 Called patient and call was started with a very disgruntle patient who was upset with Evalene who was unable to answer clinical questions about her nerve conduction study results. I did let patient know that Evalene is limited to what she can discuss as she is not clinical.   I advised patient that I have refax all EMG notes/results to referring  provider and that she needs to contact the referring provider for results because she is not a patient of our office.  Patient was extremely rude and kept cutting me off when I tried to explain to her about Mychart and EMG's and about how it does not always come up in Mychart and we don't know why. While explaining this to patient she said can you let me talk? I informed patient that I am just trying to explain things to her. I let patient know to go ahead and explain her concerns so I wouldn't interrupt. When I commented before she was done patient stated what happen to you not talking.   Due to patient being angry I informed her to please contact her referring provider for all results as I have faxed them all for her and ended the call.

## 2023-10-06 NOTE — Telephone Encounter (Signed)
 I did not see anything significant on ncv. She should be seen in next few weeks

## 2023-10-06 NOTE — Telephone Encounter (Signed)
 Pt called in stating her results are not showing up on mychart and her referring provider has not yet called her to give her results. She was worried the there was something wrong with the test. I confirmed with Mahina that the results are in and they were sent to the referring provider. Mahina is going to send them again just in case. She wants to know if there is a summary of the findings that go along with the results that will also be sent to her referring provider.

## 2023-10-06 NOTE — Telephone Encounter (Signed)
 Patient called and left a message - she is asking about her nerve conduction study results - what does it mean? What are next steps? [phone]

## 2023-10-18 ENCOUNTER — Encounter: Payer: Self-pay | Admitting: Podiatry

## 2023-10-18 ENCOUNTER — Encounter: Admitting: Neurology

## 2023-10-18 ENCOUNTER — Ambulatory Visit (INDEPENDENT_AMBULATORY_CARE_PROVIDER_SITE_OTHER): Admitting: Podiatry

## 2023-10-18 DIAGNOSIS — G5751 Tarsal tunnel syndrome, right lower limb: Secondary | ICD-10-CM

## 2023-10-18 DIAGNOSIS — G629 Polyneuropathy, unspecified: Secondary | ICD-10-CM

## 2023-10-19 NOTE — Progress Notes (Signed)
 Subjective:   Patient ID: Elizabeth Pratt, female   DOB: 63 y.o.   MRN: [id number]   HPI Patient states she is back to where she was and she feels like it is in both her forefoot and her inside of her ankle.  States the forefoot has been present for years and the inside of the ankle has gotten worse over the last 6 months.  Patient did have her nerve conduction study done   ROS      Objective:  Physical Exam  Neurovascular status unchanged with continuing to have some form of discomfort in the tarsal canal right with what appears to be a positive tarsal tunnel syndrome with the patient having forefoot pain which appears to be a difficult differentiation of inflammatory or neurologic     Assessment:  Continuing to have difficult symptoms that we have not been able to decipher as to whether or not this could be neurological neuropathy or inflammatory     Plan:  H&P spent a great deal of time discussing and at this point I did do nerve biopsies bilateral to try to better decipher whether or not there is small fiber disease.  I explained the procedures to the patient and risk and patient wants these procedures done and this was done currently.  I anesthetized with 60 mg Xylocaine with epinephrine approximately 12 cm above the lateral malleolus and took small specimens into the lateral muscle group getting out a specimen bilateral and placed in solution and sent for nerve evaluation.  Sterile dressings applied patient will wear Band-Aids for the next few days and will be seen back in 6 weeks to review results and at this time she is gena start herbal supplement and we did discuss gabapentin which she is concerned about taking and we discussed other possible treatments

## 2023-11-10 ENCOUNTER — Encounter: Payer: Self-pay | Admitting: Podiatry

## 2023-11-10 NOTE — Progress Notes (Signed)
 error

## 2023-11-29 ENCOUNTER — Ambulatory Visit: Admitting: Podiatry

## 2023-12-13 ENCOUNTER — Ambulatory Visit: Admitting: Podiatry

## 2023-12-23 ENCOUNTER — Other Ambulatory Visit: Payer: Self-pay | Admitting: Internal Medicine

## 2023-12-29 ENCOUNTER — Ambulatory Visit (INDEPENDENT_AMBULATORY_CARE_PROVIDER_SITE_OTHER): Admitting: Podiatry

## 2023-12-29 DIAGNOSIS — G5751 Tarsal tunnel syndrome, right lower limb: Secondary | ICD-10-CM

## 2023-12-29 DIAGNOSIS — G629 Polyneuropathy, unspecified: Secondary | ICD-10-CM

## 2023-12-30 NOTE — Progress Notes (Signed)
 Subjective:   Patient ID: Elizabeth Pratt, female   DOB: 63 y.o.   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  She had numerous questions and were going to start her on alpha lipoic acid currently and possibly gabapentin possibly Qutenza depending on symptoms.  All questions answered

## 2024-02-24 ENCOUNTER — Other Ambulatory Visit: Payer: Self-pay | Admitting: Internal Medicine

## 2024-02-24 NOTE — Telephone Encounter (Signed)
 Copied from CRM [id number]. Topic: Clinical - Medication Refill >> Feb 24, 2024  3:53 PM Rilla B wrote: Medication: fluticasone  furoate-vilanterol (BREO ELLIPTA ) 100-25 MCG/ACT AEPB  albuterol  (VENTOLIN  HFA) 108 (90 Base) MCG/ACT inhaler   Has the patient contacted their pharmacy? Yes (Agent: If no, request that the patient contact the pharmacy for the refill. If patient does not wish to contact the pharmacy document the reason why and proceed with request.) (Agent: If yes, when and what did the pharmacy advise?)  This is the patient's preferred pharmacy:  [id number] [place], [place] - [address] [place] [address] Phone: [phone] Fax: [phone]  Is this the correct pharmacy for this prescription? Yes If no, delete pharmacy and type the correct one.   Has the prescription been filled recently? Yes  Is the patient out of the medication? No  Has the patient been seen for an appointment in the last year OR does the patient have an upcoming appointment? Yes  Can we respond through MyChart? Yes  Agent: Please be advised that Rx refills may take up to 3 business days. We ask that you follow-up with your pharmacy.

## 2024-02-28 ENCOUNTER — Other Ambulatory Visit: Payer: Self-pay | Admitting: Internal Medicine

## 2024-02-28 DIAGNOSIS — J4520 Mild intermittent asthma, uncomplicated: Secondary | ICD-10-CM

## 2024-03-01 ENCOUNTER — Other Ambulatory Visit: Payer: Self-pay | Admitting: Internal Medicine

## 2024-03-01 DIAGNOSIS — J4520 Mild intermittent asthma, uncomplicated: Secondary | ICD-10-CM

## 2024-03-01 NOTE — Telephone Encounter (Signed)
 Copied from CRM [id number]. Topic: Clinical - Medication Refill >> Mar 01, 2024  3:04 PM Essie A wrote: Medication: albuterol  (VENTOLIN  HFA) 108 (90 Base) MCG/ACT inhaler  Has the patient contacted their pharmacy? Yes (Agent: If no, request that the patient contact the pharmacy for the refill. If patient does not wish to contact the pharmacy document the reason why and proceed with request.) (Agent: If yes, when and what did the pharmacy advise?)  This is the patient's preferred pharmacy:  [id number] [place], [place] - [address] [place] [address] Phone: [phone] Fax: [phone]  Is this the correct pharmacy for this prescription? Yes If no, delete pharmacy and type the correct one.   Has the prescription been filled recently? Yes  Is the patient out of the medication? No  Has the patient been seen for an appointment in the last year OR does the patient have an upcoming appointment? Yes  Can we respond through MyChart? Yes  Agent: Please be advised that Rx refills may take up to 3 business days. We ask that you follow-up with your pharmacy.

## 2024-03-09 MED ORDER — ALBUTEROL SULFATE HFA 108 (90 BASE) MCG/ACT IN AERS: 2.0000 | INHALATION_SPRAY | Freq: Four times a day (QID) | RESPIRATORY_TRACT | 0 refills | Status: AC | PRN

## 2024-03-09 NOTE — Telephone Encounter (Signed)
 Patient needs an appointment for farther refills
